# Patient Record
Sex: Female | Born: 1950 | Race: Black or African American | Hispanic: No | Smoking: Never smoker
Health system: Southern US, Community
[De-identification: ages and names within clinical notes are randomized; demographics above are authoritative.]

## PROBLEM LIST (undated history)

## (undated) DIAGNOSIS — H9190 Unspecified hearing loss, unspecified ear: Secondary | ICD-10-CM

## (undated) DIAGNOSIS — N9489 Other specified conditions associated with female genital organs and menstrual cycle: Secondary | ICD-10-CM

## (undated) DIAGNOSIS — H9313 Tinnitus, bilateral: Secondary | ICD-10-CM

## (undated) DIAGNOSIS — N950 Postmenopausal bleeding: Secondary | ICD-10-CM

## (undated) DIAGNOSIS — M779 Enthesopathy, unspecified: Secondary | ICD-10-CM

## (undated) DIAGNOSIS — Z9289 Personal history of other medical treatment: Secondary | ICD-10-CM

## (undated) DIAGNOSIS — D649 Anemia, unspecified: Secondary | ICD-10-CM

## (undated) DIAGNOSIS — E785 Hyperlipidemia, unspecified: Secondary | ICD-10-CM

## (undated) DIAGNOSIS — E7800 Pure hypercholesterolemia, unspecified: Secondary | ICD-10-CM

## (undated) DIAGNOSIS — M1990 Unspecified osteoarthritis, unspecified site: Secondary | ICD-10-CM

## (undated) DIAGNOSIS — E119 Type 2 diabetes mellitus without complications: Secondary | ICD-10-CM

## (undated) HISTORY — PX: TUBAL LIGATION: SHX77

## (undated) HISTORY — DX: Hyperlipidemia, unspecified: E78.5

## (undated) HISTORY — PX: COLONOSCOPY: SHX174

---

## 2011-10-16 ENCOUNTER — Other Ambulatory Visit (HOSPITAL_COMMUNITY)
Admission: RE | Admit: 2011-10-16 | Discharge: 2011-10-16 | Disposition: A | Discharge status: Home / Self Care | Payer: BC Managed Care – PPO | Source: Ambulatory Visit | Attending: Family Medicine | Admitting: Family Medicine

## 2011-10-16 DIAGNOSIS — Z01419 Encounter for gynecological examination (general) (routine) without abnormal findings: Secondary | ICD-10-CM | POA: Insufficient documentation

## 2011-10-16 DIAGNOSIS — Z1159 Encounter for screening for other viral diseases: Secondary | ICD-10-CM | POA: Insufficient documentation

## 2012-03-31 ENCOUNTER — Ambulatory Visit
Admission: RE | Admit: 2012-03-31 | Discharge: 2012-03-31 | Disposition: A | Discharge status: Home / Self Care | Payer: BC Managed Care – PPO | Source: Ambulatory Visit | Attending: Family Medicine | Admitting: Family Medicine

## 2012-03-31 ENCOUNTER — Other Ambulatory Visit: Payer: Self-pay | Admitting: Family Medicine

## 2012-03-31 DIAGNOSIS — M7500 Adhesive capsulitis of unspecified shoulder: Secondary | ICD-10-CM

## 2012-03-31 DIAGNOSIS — R52 Pain, unspecified: Secondary | ICD-10-CM

## 2013-11-13 ENCOUNTER — Other Ambulatory Visit: Payer: Self-pay | Admitting: Family Medicine

## 2013-11-13 ENCOUNTER — Ambulatory Visit
Admission: RE | Admit: 2013-11-13 | Discharge: 2013-11-13 | Disposition: A | Discharge status: Home / Self Care | Payer: BC Managed Care – PPO | Source: Ambulatory Visit | Attending: Family Medicine | Admitting: Family Medicine

## 2013-11-13 DIAGNOSIS — M25552 Pain in left hip: Secondary | ICD-10-CM

## 2013-11-13 DIAGNOSIS — M545 Low back pain: Secondary | ICD-10-CM

## 2014-08-29 ENCOUNTER — Ambulatory Visit (HOSPITAL_COMMUNITY): Payer: BC Managed Care – PPO | Attending: Emergency Medicine

## 2014-08-29 ENCOUNTER — Encounter (HOSPITAL_COMMUNITY): Payer: Self-pay | Admitting: Emergency Medicine

## 2014-08-29 ENCOUNTER — Emergency Department (HOSPITAL_COMMUNITY)
Admission: EM | Admit: 2014-08-29 | Discharge: 2014-08-29 | Disposition: A | Discharge status: Home / Self Care | Payer: BC Managed Care – PPO | Source: Home / Self Care | Attending: Emergency Medicine | Admitting: Emergency Medicine

## 2014-08-29 DIAGNOSIS — M1610 Unilateral primary osteoarthritis, unspecified hip: Secondary | ICD-10-CM | POA: Insufficient documentation

## 2014-08-29 DIAGNOSIS — M169 Osteoarthritis of hip, unspecified: Secondary | ICD-10-CM | POA: Diagnosis present

## 2014-08-29 DIAGNOSIS — Y9229 Other specified public building as the place of occurrence of the external cause: Secondary | ICD-10-CM

## 2014-08-29 DIAGNOSIS — S82402A Unspecified fracture of shaft of left fibula, initial encounter for closed fracture: Secondary | ICD-10-CM

## 2014-08-29 DIAGNOSIS — W108XXA Fall (on) (from) other stairs and steps, initial encounter: Secondary | ICD-10-CM

## 2014-08-29 DIAGNOSIS — W19XXXA Unspecified fall, initial encounter: Secondary | ICD-10-CM

## 2014-08-29 DIAGNOSIS — S82409A Unspecified fracture of shaft of unspecified fibula, initial encounter for closed fracture: Secondary | ICD-10-CM

## 2014-08-29 HISTORY — DX: Pure hypercholesterolemia, unspecified: E78.00

## 2014-08-29 HISTORY — DX: Type 2 diabetes mellitus without complications: E11.9

## 2014-08-29 MED ORDER — HYDROCODONE-ACETAMINOPHEN 5-325 MG PO TABS
ORAL_TABLET | ORAL | Status: AC
  Filled 2014-08-29: qty 2

## 2014-08-29 MED ORDER — HYDROCODONE-ACETAMINOPHEN 5-325 MG PO TABS
2.0000 | ORAL_TABLET | Freq: Once | ORAL | Status: AC
  Administered 2014-08-29: 2 via ORAL

## 2014-08-29 MED ORDER — HYDROCODONE-ACETAMINOPHEN 5-325 MG PO TABS: ORAL_TABLET | ORAL | Status: AC

## 2014-08-29 NOTE — Discharge Instructions (Signed)
Fibular Fracture, Ankle, Adult, Treated With or Without Immobilization °A fibular fracture at your ankle is a break (fracture) bone in the smallest of the two bones in your lower leg, located on the outside of your leg (fibula) close to the area at your ankle joint. °CAUSES °· Rolling your ankle. °· Twisting your ankle. °· Extreme flexing or extending of your foot. °· Severe force on your ankle as when falling from a distance. °RISK FACTORS °· Jumping activities. °· Participation in sports. °· Osteoporosis. °· Advanced age. °· Previous ankle injuries. °SIGNS AND SYMPTOMS °· Pain. °· Swelling. °· Inability to put weight on injured ankle. °· Bruising. °· Bone deformities at site of injury. °DIAGNOSIS  °This fracture is diagnosed with the help of an X-ray exam. °TREATMENT  °If the fractured bone did not move out of place it usually will heal without problems and does casting or splinting. If immobilization is needed for comfort or the fractured bone moved out of place and will not heal properly with immobilization, a cast or splint will be used. °HOME CARE INSTRUCTIONS  °· Apply ice to the area of injury: °¨ Put ice in a plastic bag. °¨ Place a towel between your skin and the bag. °¨ Leave the ice on for 20 minutes, 2-3 times a day. °· Use crutches as directed. Resume walking without crutches as directed by your health care provider. °· Only take over-the-counter or prescription medicines for pain, discomfort, or fever as directed by your health care provider. °· If you have a removable splint or boot, do not remove the boot unless directed by your health care provider. °SEEK MEDICAL CARE IF:  °· You have continued pain or more swelling °· The medications do not control the pain. °SEEK IMMEDIATE MEDICAL CARE IF: °· You develop severe pain in the leg or foot. °· Your skin or nails below the injury turn blue or grey or feel cold or numb. °MAKE SURE YOU:  °· Understand these instructions. °· Will watch your  condition. °· Will get help right away if you are not doing well or get worse. °Document Released: 12/10/2005 Document Revised: 09/30/2013 Document Reviewed: 07/22/2013 °ExitCare® Patient Information ©2015 [employer]. This information is not intended to replace advice given to you by your health care provider. Make sure you discuss any questions you have with your health care provider. ° °

## 2014-08-29 NOTE — ED Notes (Signed)
Fell @ 1500. She was going down steps and missed the last step.  She fell forward onto her hands and knees.  C/o pain L ankle all the way up her leg to her knee, low back and L hip.  Not ambulatory since fall.  R ankle swollen lat .>  medial. She has had ice on it since she has been here.

## 2014-08-29 NOTE — ED Notes (Signed)
Called radiology and made them aware of x-rays.

## 2014-08-29 NOTE — ED Notes (Signed)
Patient transported to X-ray to hospital by shuttle with one family member.

## 2014-08-29 NOTE — ED Provider Notes (Signed)
Chief Complaint   Chief Complaint  Patient presents with  . Fall    History of Present Illness   Shannon [name] is a 63 year old female who fell today around 3 PM while at church. She was going down some steps, when she missed 2 steps, falling forward on the sidewalk. She denies any syncope or presyncope, and states that she just slipped and fell. She did not hit her head and there was no loss of consciousness. She was able to get up but had pain in her left leg and was unable to bear weight. She now has pain in her left ankle with swelling over lateral malleolus, pain in the left hip overlying the greater trochanter, and pain in the lower back. She denies any headache, neck pain, chest or abdominal pain, upper back pain, or upper extremity pain. She is nonambulatory. Denies any neurological symptoms or vomiting.  Review of Systems   Other than as noted above, the patient denies any of the following symptoms: ENT:  No headache, facial pain, or bleeding from the nose or ears.  No loose or broken teeth. Neck:  No neck pain or stiffnes. Cardiac:  No chest pain. No palpitations, dizziness, syncope or fainting. GI:  No abdominal pain. No nausea or vomiting. M-S:  No extremity pain, swelling, bruising, limited ROM, or back pain. Neuro:  No loss of consciousness, seizure activity, dizziness, vertigo, paresthesias, numbness, or weakness.  No difficulty with speech or ambulation.  [place]   Past medical history, family history, social history, meds, and allergies were reviewed.  She is allergic to sulfa. She takes medication for cholesterol and diabetes.  Physical Examination    Vital signs:  BP 140/88  Pulse 75  Temp(Src) 99.3 F (37.4 C) (Oral)  Resp 14  SpO2 100% General:  Alert, oriented and in no distress. Eye:  PERRL, full EOMs. ENT:  No cranial or facial tenderness to palpation. Neck:  No tenderness to palpation.  Full ROM without pain. Heart:  Regular rhythm.  No extrasystoles,  gallops, or murmers. Lungs:  No chest wall tenderness to palpation. Breath sounds clear and equal bilaterally.  No wheezes, rales or rhonchi. Abdomen:  Non tender. Back:  She has midline tenderness to palpation in the lower lumbar spine.  Full ROM without pain. Extremities:  There is swelling and pain to palpation over the lateral malleolus of the left ankle. The ankle has a limited range of motion with pain. Exam of the hip reveals pain to palpation of the greater trochanter.  Full ROM of all joints without pain.  Pulses full.  Brisk capillary refill. Neuro:  Alert and oriented times 3.  Cranial nerves intact.  No muscle weakness.  Sensation intact to light touch.  Gait normal. Skin:  No bruising, abrasions, or lacerations.  Radiology   Dg Lumbar Spine Complete  08/29/2014   CLINICAL DATA:  Fall, low back pain  EXAM: LUMBAR SPINE - COMPLETE 4+ VIEW  COMPARISON:  11/13/2013  FINDINGS: Five lumbar type vertebral bodies.  Normal lumbar lordosis.  No evidence of fracture or dislocation. Vertebral body heights are maintained.  Mild multilevel degenerative changes.  Visualized bony pelvis appears intact.  Calcified uterine fibroid.  IMPRESSION: No fracture or dislocation is seen.  Mild degenerative changes.   Electronically Signed   By: Julian Hy M.D.   On: 08/29/2014 17:28   Dg Hip Complete Left  08/29/2014   CLINICAL DATA:  Fall.  Left lateral hip pain.  EXAM: LEFT HIP - COMPLETE  2+ VIEW  COMPARISON:  11/13/2013  FINDINGS: Prominent degenerative spurring of both hips. Probable degenerative subcortical cyst formation along the acetabulum. Appearance similar to prior exam. No acute bony findings.  Calcified uterine fibroid.  IMPRESSION: 1. Degenerative arthropathy of both hips. No acute bony findings. If the patient is unable to bear weight or if further workup is warranted, MRI would be the preferred imaging method although CT could be alternatively utilized.   Electronically Signed   By: Sherryl Barters M.D.   On: 08/29/2014 17:35   Dg Ankle Complete Left  08/29/2014   CLINICAL DATA:  Fall, lateral ankle pain  EXAM: LEFT ANKLE COMPLETE - 3+ VIEW  COMPARISON:  None.  FINDINGS: Nondisplaced oblique fracture involving the distal fibula.  Ankle mortise is otherwise intact.  Overlying of moderate soft tissue swelling.  Moderate posterior and plantar calcaneal enthesophyte.  IMPRESSION: Nondisplaced oblique fracture involving the distal fibula.   Electronically Signed   By: Julian Hy M.D.   On: 08/29/2014 17:27    Course in Urgent [place]   She was placed in a Cam Walker boot and given crutches for ambulation.  Assessment   The primary encounter diagnosis was Closed fibular fracture, left, initial encounter. Diagnoses of Fall, initial encounter and Place of occurrence, public building were also pertinent to this visit.  Plan   1.  Meds:  The following meds were prescribed:   New Prescriptions   HYDROCODONE-ACETAMINOPHEN (NORCO/VICODIN) 5-325 MG PER TABLET    1 to 2 tabs every 4 to 6 hours as needed for pain.    2.  Patient Education/Counseling:  The patient was given appropriate handouts, self care instructions, and instructed in symptomatic relief.    3.  Follow up:  The patient was told to follow up here if no better in 3 to 4 days, or sooner if becoming worse in any way, and given some red flag symptoms such as increasing pain or new neurological symptoms which would prompt immediate return.  Follow up with [place] within the next week.     Harden Mo, MD 08/29/14 [id number]

## 2015-03-29 ENCOUNTER — Ambulatory Visit
Admission: RE | Admit: 2015-03-29 | Discharge: 2015-03-29 | Disposition: A | Discharge status: Home / Self Care | Payer: BC Managed Care – PPO | Source: Ambulatory Visit | Attending: Family Medicine | Admitting: Family Medicine

## 2015-03-29 ENCOUNTER — Other Ambulatory Visit: Payer: Self-pay | Admitting: Family Medicine

## 2015-03-29 DIAGNOSIS — M150 Primary generalized (osteo)arthritis: Secondary | ICD-10-CM

## 2017-12-10 ENCOUNTER — Ambulatory Visit
Admission: RE | Admit: 2017-12-10 | Discharge: 2017-12-10 | Disposition: A | Discharge status: Home / Self Care | Payer: Medicare Other | Source: Ambulatory Visit | Attending: Internal Medicine | Admitting: Internal Medicine

## 2017-12-10 ENCOUNTER — Other Ambulatory Visit: Payer: Self-pay | Admitting: Internal Medicine

## 2017-12-10 DIAGNOSIS — R059 Cough, unspecified: Secondary | ICD-10-CM

## 2017-12-10 DIAGNOSIS — R05 Cough: Secondary | ICD-10-CM

## 2020-06-02 DIAGNOSIS — M160 Bilateral primary osteoarthritis of hip: Secondary | ICD-10-CM | POA: Diagnosis not present

## 2020-06-02 DIAGNOSIS — M542 Cervicalgia: Secondary | ICD-10-CM | POA: Diagnosis not present

## 2020-06-02 DIAGNOSIS — Z13220 Encounter for screening for lipoid disorders: Secondary | ICD-10-CM | POA: Diagnosis not present

## 2020-06-02 DIAGNOSIS — Z131 Encounter for screening for diabetes mellitus: Secondary | ICD-10-CM | POA: Diagnosis not present

## 2020-06-02 DIAGNOSIS — G8929 Other chronic pain: Secondary | ICD-10-CM | POA: Diagnosis not present

## 2020-06-02 DIAGNOSIS — Z20822 Contact with and (suspected) exposure to covid-19: Secondary | ICD-10-CM | POA: Diagnosis not present

## 2020-06-02 DIAGNOSIS — Z79899 Other long term (current) drug therapy: Secondary | ICD-10-CM | POA: Diagnosis not present

## 2020-06-02 DIAGNOSIS — Z6834 Body mass index (BMI) 34.0-34.9, adult: Secondary | ICD-10-CM | POA: Diagnosis not present

## 2020-06-02 DIAGNOSIS — M545 Low back pain: Secondary | ICD-10-CM | POA: Diagnosis not present

## 2020-06-14 DIAGNOSIS — Z1211 Encounter for screening for malignant neoplasm of colon: Secondary | ICD-10-CM | POA: Diagnosis not present

## 2020-06-14 DIAGNOSIS — Z1212 Encounter for screening for malignant neoplasm of rectum: Secondary | ICD-10-CM | POA: Diagnosis not present

## 2020-07-02 ENCOUNTER — Ambulatory Visit: Payer: Medicare Other | Attending: Internal Medicine

## 2020-07-02 DIAGNOSIS — Z23 Encounter for immunization: Secondary | ICD-10-CM

## 2020-07-02 NOTE — Progress Notes (Signed)
   Covid-19 Vaccination Clinic  Name:  Terea Neubauer    MRN: [id number] DOB: Dec 08, 1951  07/02/2020  Ms. Gaul was observed post Covid-19 immunization for 15 minutes without incident. She was provided with Vaccine Information Sheet and instruction to access the V-Safe system.   Ms. Bradburn was instructed to call 911 with any severe reactions post vaccine: Marland Kitchen Difficulty breathing  . Swelling of face and throat  . A fast heartbeat  . A bad rash all over body  . Dizziness and weakness   Immunizations Administered    Name Date Dose VIS Date Route   Pfizer COVID-19 Vaccine 07/02/2020 10:08 AM 0.3 mL 02/17/2019 Intramuscular   Manufacturer: [employer], [employer]   Lot: [id number]   [place]: [id number]

## 2020-07-23 ENCOUNTER — Ambulatory Visit: Payer: Medicare PPO | Attending: Internal Medicine

## 2020-07-23 DIAGNOSIS — Z23 Encounter for immunization: Secondary | ICD-10-CM

## 2020-07-23 NOTE — Progress Notes (Signed)
   Covid-19 Vaccination Clinic  Name:  Lonni Dirden    MRN: [id number] DOB: 12/15/1951  07/23/2020  Ms. Lemmerman was observed post Covid-19 immunization for 15 minutes without incident. She was provided with Vaccine Information Sheet and instruction to access the V-Safe system.   Ms. Blumenberg was instructed to call 911 with any severe reactions post vaccine: Marland Kitchen Difficulty breathing  . Swelling of face and throat  . A fast heartbeat  . A bad rash all over body  . Dizziness and weakness   Immunizations Administered    Name Date Dose VIS Date Route   Pfizer COVID-19 Vaccine 07/23/2020  9:16 AM 0.3 mL 02/17/2019 Intramuscular   Manufacturer: [employer], [employer]   Lot: [id number]   [place]: [id number]

## 2020-09-02 ENCOUNTER — Encounter (HOSPITAL_COMMUNITY): Payer: Self-pay

## 2020-09-02 ENCOUNTER — Other Ambulatory Visit: Payer: Self-pay

## 2020-09-02 ENCOUNTER — Emergency Department (HOSPITAL_COMMUNITY)
Admission: EM | Admit: 2020-09-02 | Discharge: 2020-09-03 | Disposition: A | Discharge status: Home / Self Care | Payer: Medicare PPO | Attending: Emergency Medicine | Admitting: Emergency Medicine

## 2020-09-02 ENCOUNTER — Ambulatory Visit (HOSPITAL_COMMUNITY)
Admission: EM | Admit: 2020-09-02 | Discharge: 2020-09-02 | Disposition: A | Discharge status: Home / Self Care | Payer: Medicare PPO | Attending: Emergency Medicine | Admitting: Emergency Medicine

## 2020-09-02 DIAGNOSIS — N838 Other noninflammatory disorders of ovary, fallopian tube and broad ligament: Secondary | ICD-10-CM | POA: Diagnosis not present

## 2020-09-02 DIAGNOSIS — N939 Abnormal uterine and vaginal bleeding, unspecified: Secondary | ICD-10-CM | POA: Diagnosis not present

## 2020-09-02 DIAGNOSIS — N950 Postmenopausal bleeding: Secondary | ICD-10-CM | POA: Diagnosis not present

## 2020-09-02 DIAGNOSIS — R102 Pelvic and perineal pain: Secondary | ICD-10-CM | POA: Diagnosis not present

## 2020-09-02 DIAGNOSIS — E119 Type 2 diabetes mellitus without complications: Secondary | ICD-10-CM | POA: Diagnosis not present

## 2020-09-02 DIAGNOSIS — R109 Unspecified abdominal pain: Secondary | ICD-10-CM

## 2020-09-02 DIAGNOSIS — Z79899 Other long term (current) drug therapy: Secondary | ICD-10-CM | POA: Diagnosis not present

## 2020-09-02 DIAGNOSIS — R1030 Lower abdominal pain, unspecified: Secondary | ICD-10-CM | POA: Diagnosis not present

## 2020-09-02 LAB — BASIC METABOLIC PANEL
Anion gap: 9 (ref 5–15)
BUN: 12 mg/dL (ref 8–23)
CO2: 23 mmol/L (ref 22–32)
Calcium: 9.4 mg/dL (ref 8.9–10.3)
Chloride: 107 mmol/L (ref 98–111)
Creatinine, Ser: 0.91 mg/dL (ref 0.44–1.00)
GFR calc Af Amer: >60 mL/min (ref >60)
GFR calc non Af Amer: >60 mL/min (ref >60)
Glucose, Bld: 93 mg/dL (ref 70–99)
Potassium: 4.1 mmol/L (ref 3.5–5.1)
Sodium: 139 mmol/L (ref 135–145)

## 2020-09-02 LAB — URINALYSIS, ROUTINE W REFLEX MICROSCOPIC
Bilirubin Urine: NEGATIVE
Glucose, UA: NEGATIVE mg/dL
Ketones, ur: 20 mg/dL — AB
Leukocytes,Ua: NEGATIVE
Nitrite: NEGATIVE
Protein, ur: NEGATIVE mg/dL
Specific Gravity, Urine: 1.019 (ref 1.005–1.030)
pH: 5 (ref 5.0–8.0)

## 2020-09-02 LAB — CBC
HCT: 42.7 % (ref 36.0–46.0)
Hemoglobin: 13.6 g/dL (ref 12.0–15.0)
MCH: 28.3 pg (ref 26.0–34.0)
MCHC: 31.9 g/dL (ref 30.0–36.0)
MCV: 88.8 fL (ref 80.0–100.0)
Platelets: 301 10*3/uL (ref 150–400)
RBC: 4.81 MIL/uL (ref 3.87–5.11)
RDW: 12.3 % (ref 11.5–15.5)
WBC: 4.7 10*3/uL (ref 4.0–10.5)
nRBC: 0 % (ref 0.0–0.2)

## 2020-09-02 LAB — POCT URINALYSIS DIPSTICK, ED / UC
Bilirubin Urine: NEGATIVE
Glucose, UA: NEGATIVE mg/dL
Ketones, ur: NEGATIVE mg/dL
Nitrite: NEGATIVE
Protein, ur: NEGATIVE mg/dL
Specific Gravity, Urine: <=1.005 (ref 1.005–1.030)
Urobilinogen, UA: 0.2 mg/dL (ref 0.0–1.0)
pH: 5 (ref 5.0–8.0)

## 2020-09-02 MED ORDER — HYDROMORPHONE HCL 1 MG/ML IJ SOLN
0.5000 mg | Freq: Once | INTRAMUSCULAR | Status: AC
Start: 2020-09-02 — End: 2020-09-02
  Administered 2020-09-02: 0.5 mg via INTRAVENOUS
  Filled 2020-09-02: qty 1

## 2020-09-02 NOTE — ED Provider Notes (Signed)
[facility]    CSN: [id number] Arrival date & time: 09/02/20  5809      History   Chief Complaint Chief Complaint  Patient presents with   Abdominal Pain    HPI Cardelia Sassano is a 69 y.o. female.   Brenton Grills presents with complaints of abdominal pain. Started two days ago. Today has become more intense. Had vaginal spotting two days ago, today with "gush" of vaginal bleeding. Pain is "almost unbearable."  Now still with dark red vaginal bleeding. No dizziness. No fevers. No previous abdominal or pelvic surgeries. History of tubal ligation. 4 pregnancies, 4 children. No nausea or vomiting. No constipation. Does have a gynecologist, next appointment is next month. Pain was 10/10 on arrival, states now is 9/10. No urinary symptoms. Back pain, across mid back. No fevers. Denies any previous similar.     ROS per HPI, negative if not otherwise mentioned.      Past Medical History:  Diagnosis Date   Diabetes mellitus without complication ([place])    prediabetic   High cholesterol     There are no problems to display for this patient.   History reviewed. No pertinent surgical history.  OB History   No obstetric history on file.      Home Medications    Prior to Admission medications   Medication Sig Start Date End Date Taking? Authorizing Provider  Ascorbic Acid (VITAMIN C) 100 MG tablet Take 100 mg by mouth daily.   Yes [provider]  Calcium 250 MG CAPS Take by mouth.   Yes [provider]  Multiple Vitamin (MULTIVITAMIN) tablet Take 1 tablet by mouth daily.   Yes [provider]  atorvastatin (LIPITOR) 10 MG tablet Take 10 mg by mouth daily.    [provider]  HYDROcodone-acetaminophen (NORCO/VICODIN) 5-325 MG per tablet 1 to 2 tabs every 4 to 6 hours as needed for pain. 08/29/14   Harden Mo, MD    Family History Family History  Problem Relation Age of Onset   Dementia Mother    Hypertension  Mother    Heart disease Mother    Pneumonia Father     Social History Social History   Tobacco Use   Smoking status: Never Smoker   Smokeless tobacco: Never Used  Substance Use Topics   Alcohol use: No   Drug use: No     Allergies   Sulfa antibiotics   Review of Systems Review of Systems   Physical Exam Triage Vital Signs ED Triage Vitals [09/02/20 1041]  Enc Vitals Group     BP 118/82     Pulse Rate 76     Resp 16     Temp 98.3 F (36.8 C)     Temp Source Oral     SpO2 100 %     Weight 207 lb (93.9 kg)     Height 5\' 5"  (1.651 m)     Head Circumference      Peak Flow      Pain Score 10     Pain Loc      Pain Edu?      Excl. in [place]?    No data found.  Updated Vital Signs BP 118/82    Pulse 76    Temp 98.3 F (36.8 C) (Oral)    Resp 16    Ht 5\' 5"  (1.651 m)    Wt 207 lb (93.9 kg)    SpO2 100%    BMI 34.45 kg/m  Visual Acuity Right Eye Distance:   Left Eye Distance:   Bilateral Distance:    Right Eye Near:   Left Eye Near:    Bilateral Near:     Physical Exam Constitutional:      General: She is not in acute distress.    Appearance: She is well-developed.  Cardiovascular:     Rate and Rhythm: Normal rate.  Pulmonary:     Effort: Pulmonary effort is normal.  Abdominal:     Tenderness: There is abdominal tenderness in the right lower quadrant, periumbilical area, suprapubic area and left lower quadrant.     Comments: Low abdominal tender on even light palpation   Skin:    General: Skin is warm and dry.  Neurological:     Mental Status: She is alert and oriented to person, place, and time.      UC Treatments / Results  Labs (all labs ordered are listed, but only abnormal results are displayed) Labs Reviewed  POCT URINALYSIS DIPSTICK, ED / UC - Abnormal; Notable for the following components:      Result Value   Hgb urine dipstick MODERATE (*)    Leukocytes,Ua TRACE (*)    All other components within normal limits     EKG   Radiology No results found.  Procedures Procedures (including critical care time)  Medications Ordered in UC Medications - No data to display  Initial Impression / Assessment and Plan / UC Course  I have reviewed the triage vital signs and the nursing notes.  Pertinent labs & imaging results that were available during my care of the patient were reviewed by me and considered in my medical decision making (see chart for details).     69 year old female with 9-10/10 worsening low abdominal pain with vaginal bleeding. Worsening over the past two days. No fevers. No tachycardia. I do feel that imaging is warranted with the amount of pain she is having, in the presence of post menopausal vaginal bleeding. Recommend further evaluation in the ER. Patient's daughter will take her there now. Patient verbalized understanding and agreeable to plan. Wheel chair assistance provided to vehicle  Final Clinical Impressions(s) / UC Diagnoses   Final diagnoses:  Pelvic pain in female  Postmenopausal vaginal bleeding     Discharge Instructions     With your pain worsening, and with postmenopausal vaginal bleeding, I feel that imaging is warranted to further evaluate this. I feel that going to the ER is going to be most appropriate, as we do not have adequate imaging capabilities here in the Urgent care, and as it being a Friday heading into the weekend this should be evaluated.     ED Prescriptions    None     PDMP not reviewed this encounter.   Zigmund Gottron, NP 09/02/20 1115

## 2020-09-02 NOTE — ED Notes (Signed)
Patient is being discharged from the Urgent Care and sent to the Emergency Department via POV . Per Lavone Neri, NP, patient is in need of higher level of care due to further testing. Patient is aware and verbalizes understanding of plan of care.  Vitals:   09/02/20 1041  BP: 118/82  Pulse: 76  Resp: 16  Temp: 98.3 F (36.8 C)  SpO2: 100%

## 2020-09-02 NOTE — Discharge Instructions (Signed)
With your pain worsening, and with postmenopausal vaginal bleeding, I feel that imaging is warranted to further evaluate this. I feel that going to the ER is going to be most appropriate, as we do not have adequate imaging capabilities here in the Urgent care, and as it being a Friday heading into the weekend this should be evaluated.

## 2020-09-02 NOTE — ED Triage Notes (Signed)
Pt arrives to ED w/ c/o vaginal bleeding and lower abdominal pain that started on Weds. Pt reports 9/10 pain.

## 2020-09-02 NOTE — ED Provider Notes (Signed)
[facility] Provider Note   CSN: [id number] Arrival date & time: 09/02/20  1124     History Chief Complaint  Patient presents with  . Vaginal Bleeding    Shannon [name] is a 69 y.o. female.  Presents to ER with concern for abdominal pain, vaginal bleeding.  She reports over the past couple weeks she has had mild crampy lower abdominal pain however the last couple days has had significant increase in her abdominal pain.  Yesterday also noted vaginal spotting and then today noted large amount of vaginal bleeding.  Has gone through about 4 pads today.  Currently pain is lower, worse on the left side, achy, crampy pain.  7 out of 10 in severity.  No associated nausea, vomiting, diarrhea or constipation.  Not sexually active, no vaginal discharge.  No dysuria or hematuria.  No recent weight loss.  HPI     Past Medical History:  Diagnosis Date  . Diabetes mellitus without complication ([facility])    prediabetic  . High cholesterol     There are no problems to display for this patient.   History reviewed. No pertinent surgical history.   OB History   No obstetric history on file.     Family History  Problem Relation Age of Onset  . Dementia Mother   . Hypertension Mother   . Heart disease Mother   . Pneumonia Father     Social History   Tobacco Use  . Smoking status: Never Smoker  . Smokeless tobacco: Never Used  Substance Use Topics  . Alcohol use: No  . Drug use: No    Home Medications Prior to Admission medications   Medication Sig Start Date End Date Taking? Authorizing Provider  Ascorbic Acid (VITAMIN C) 100 MG tablet Take 100 mg by mouth daily.    [provider]  atorvastatin (LIPITOR) 10 MG tablet Take 10 mg by mouth daily.    [provider]  Calcium 250 MG CAPS Take by mouth.    [provider]  HYDROcodone-acetaminophen (NORCO/VICODIN) 5-325 MG per tablet 1 to 2 tabs every 4 to 6 hours as  needed for pain. 08/29/14   Harden Mo, MD  Multiple Vitamin (MULTIVITAMIN) tablet Take 1 tablet by mouth daily.    [provider]    Allergies    Sulfa antibiotics  Review of Systems   Review of Systems  Constitutional: Negative for chills and fever.  HENT: Negative for ear pain and sore throat.   Eyes: Negative for pain and visual disturbance.  Respiratory: Negative for cough and shortness of breath.   Cardiovascular: Negative for chest pain and palpitations.  Gastrointestinal: Positive for abdominal pain. Negative for vomiting.  Genitourinary: Positive for vaginal bleeding. Negative for dysuria and hematuria.  Musculoskeletal: Negative for arthralgias and back pain.  Skin: Negative for color change and rash.  Neurological: Negative for seizures and syncope.  All other systems reviewed and are negative.   Physical Exam Updated Vital Signs BP (!) 158/69   Pulse 71   Temp 98.5 F (36.9 C) (Oral)   Resp 18   Ht 5\' 5"  (1.651 m)   Wt 93.9 kg   SpO2 100%   BMI 34.45 kg/m   Physical Exam Vitals and nursing note reviewed.  Constitutional:      General: She is not in acute distress.    Appearance: She is well-developed.  HENT:     Head: Normocephalic and atraumatic.  Eyes:  Conjunctiva/sclera: Conjunctivae normal.  Cardiovascular:     Rate and Rhythm: Normal rate and regular rhythm.     Heart sounds: No murmur heard.   Pulmonary:     Effort: Pulmonary effort is normal. No respiratory distress.     Breath sounds: Normal breath sounds.  Abdominal:     Palpations: Abdomen is soft.     Tenderness: There is no abdominal tenderness.  Genitourinary:    Comments: Normal-appearing external genitalia, small blood noted in vagina, cervix appears normal, no masses palpated, no adnexal fullness or cervical motion tenderness  [place] RN present for chaperone Musculoskeletal:     Cervical back: Neck supple.  Skin:    General: Skin is warm and dry.   Neurological:     Mental Status: She is alert.     ED Results / Procedures / Treatments   Labs (all labs ordered are listed, but only abnormal results are displayed) Labs Reviewed  URINALYSIS, ROUTINE W REFLEX MICROSCOPIC - Abnormal; Notable for the following components:      Result Value   Hgb urine dipstick MODERATE (*)    Ketones, ur 20 (*)    Bacteria, UA RARE (*)    All other components within normal limits  URINE CULTURE  CBC  BASIC METABOLIC PANEL    EKG None  Radiology No results found.  Procedures Procedures (including critical care time)  Medications Ordered in ED Medications  HYDROmorphone (DILAUDID) injection 0.5 mg (0.5 mg Intravenous Given 09/02/20 2222)    ED Course  I have reviewed the triage vital signs and the nursing notes.  Pertinent labs & imaging results that were available during my care of the patient were reviewed by me and considered in my medical decision making (see chart for details).    MDM Rules/Calculators/A&P                         69 year old lady presenting to ER with concern for lower abdominal pain, vaginal bleeding.  On exam patient is well-appearing with stable vital signs.  Noted generalized tenderness in her abdomen.  There is small amount of vaginal bleeding noted on speculum exam.  CT scan ordered to further evaluate.  If CT scan negative, patient likely would benefit from ultrasound to further assess.  While awaiting imaging and reassessment, patient signed out to Dr. Leonette Monarch.   Final Clinical Impression(s) / ED Diagnoses Final diagnoses:  Vaginal bleeding  Abdominal pain, unspecified abdominal location  Post-menopausal bleeding    Rx / DC Orders ED Discharge Orders    None       Lucrezia Starch, MD 09/02/20 2349

## 2020-09-02 NOTE — ED Triage Notes (Signed)
Pt c/o 10/10 sharp pain in RLQ, LLQ of abdomen and vaginal bleedingx2 days. Pt denies soaking more than 1 pad an hour. Pt denies N/V/D, constipation.

## 2020-09-03 ENCOUNTER — Other Ambulatory Visit (HOSPITAL_COMMUNITY): Payer: Medicare PPO

## 2020-09-03 ENCOUNTER — Emergency Department (HOSPITAL_COMMUNITY): Payer: Medicare PPO

## 2020-09-03 DIAGNOSIS — N838 Other noninflammatory disorders of ovary, fallopian tube and broad ligament: Secondary | ICD-10-CM | POA: Diagnosis not present

## 2020-09-03 MED ORDER — IOHEXOL 300 MG/ML  SOLN
100.0000 mL | Freq: Once | INTRAMUSCULAR | Status: AC | PRN
  Administered 2020-09-03: 100 mL via INTRAVENOUS

## 2020-09-03 MED ORDER — HYDROMORPHONE HCL 1 MG/ML IJ SOLN
0.5000 mg | Freq: Once | INTRAMUSCULAR | Status: AC
  Administered 2020-09-03: 0.5 mg via INTRAVENOUS
  Filled 2020-09-03: qty 1

## 2020-09-03 MED ORDER — ACETAMINOPHEN 500 MG PO TABS
1000.0000 mg | ORAL_TABLET | Freq: Once | ORAL | Status: AC
  Administered 2020-09-03: 1000 mg via ORAL
  Filled 2020-09-03: qty 2

## 2020-09-03 NOTE — Discharge Instructions (Addendum)
This is the findings from your ultrasound:  1. 6.3 x 4.8 x 5.7 cm right adnexal mass, corresponding with  abnormality seen on prior CT, and appears to correspond with an  abnormally enlarged and heterogeneous right ovary. While no definite  discrete mass is measurable by ultrasound, overall appearance  remains concerning for a possible ovarian neoplasm. Gynecologic  referral for further workup and surgical consultation recommended.  Additionally, further assessment with dedicated pelvic MRI, with and  without contrast, may be helpful for further characterization as  clinically warranted.  2. No evidence for right ovarian torsion.  3. Nonvisualization of the left ovary. No left adnexal mass.  4. Thickened endometrial stripe measuring up to 8 mm, considered  abnormal for an asymptomatic post-menopausal female. Endometrial  sampling should be considered to exclude carcinoma.  5. 1.7 cm left uterine fibroid

## 2020-09-04 LAB — URINE CULTURE: Culture: <10000 — AB

## 2020-09-05 DIAGNOSIS — N879 Dysplasia of cervix uteri, unspecified: Secondary | ICD-10-CM | POA: Diagnosis not present

## 2020-09-05 DIAGNOSIS — N950 Postmenopausal bleeding: Secondary | ICD-10-CM | POA: Diagnosis not present

## 2020-09-05 DIAGNOSIS — R1900 Intra-abdominal and pelvic swelling, mass and lump, unspecified site: Secondary | ICD-10-CM | POA: Diagnosis not present

## 2020-09-05 DIAGNOSIS — R1909 Other intra-abdominal and pelvic swelling, mass and lump: Secondary | ICD-10-CM | POA: Diagnosis not present

## 2020-09-12 DIAGNOSIS — R102 Pelvic and perineal pain: Secondary | ICD-10-CM | POA: Diagnosis not present

## 2020-09-12 DIAGNOSIS — N83291 Other ovarian cyst, right side: Secondary | ICD-10-CM | POA: Diagnosis not present

## 2020-09-12 DIAGNOSIS — N950 Postmenopausal bleeding: Secondary | ICD-10-CM | POA: Diagnosis not present

## 2020-09-15 ENCOUNTER — Telehealth: Payer: Self-pay | Admitting: *Deleted

## 2020-09-15 NOTE — Telephone Encounter (Signed)
Called patient and scheduled for a new patient visit on 9/28 at 9:45am. Gave the address and phone number for the clinic. Gave the policy for mask and visitor

## 2020-09-19 ENCOUNTER — Encounter: Payer: Self-pay | Admitting: Gynecologic Oncology

## 2020-09-20 ENCOUNTER — Other Ambulatory Visit: Payer: Self-pay

## 2020-09-20 ENCOUNTER — Encounter: Payer: Self-pay | Admitting: Gynecologic Oncology

## 2020-09-20 ENCOUNTER — Inpatient Hospital Stay: Payer: Medicare PPO | Attending: Gynecologic Oncology | Admitting: Gynecologic Oncology

## 2020-09-20 ENCOUNTER — Other Ambulatory Visit: Payer: Self-pay | Admitting: Gynecologic Oncology

## 2020-09-20 VITALS — BP 144/67 | HR 77 | Temp 98.1°F | Resp 18 | Ht 65.0 in | Wt 205.4 lb

## 2020-09-20 DIAGNOSIS — R9389 Abnormal findings on diagnostic imaging of other specified body structures: Secondary | ICD-10-CM | POA: Diagnosis not present

## 2020-09-20 DIAGNOSIS — N9489 Other specified conditions associated with female genital organs and menstrual cycle: Secondary | ICD-10-CM

## 2020-09-20 DIAGNOSIS — M1990 Unspecified osteoarthritis, unspecified site: Secondary | ICD-10-CM | POA: Insufficient documentation

## 2020-09-20 DIAGNOSIS — E669 Obesity, unspecified: Secondary | ICD-10-CM | POA: Diagnosis not present

## 2020-09-20 DIAGNOSIS — E119 Type 2 diabetes mellitus without complications: Secondary | ICD-10-CM | POA: Insufficient documentation

## 2020-09-20 DIAGNOSIS — E7800 Pure hypercholesterolemia, unspecified: Secondary | ICD-10-CM | POA: Insufficient documentation

## 2020-09-20 DIAGNOSIS — D398 Neoplasm of uncertain behavior of other specified female genital organs: Secondary | ICD-10-CM

## 2020-09-20 DIAGNOSIS — N950 Postmenopausal bleeding: Secondary | ICD-10-CM

## 2020-09-20 DIAGNOSIS — E785 Hyperlipidemia, unspecified: Secondary | ICD-10-CM | POA: Diagnosis not present

## 2020-09-20 DIAGNOSIS — N840 Polyp of corpus uteri: Secondary | ICD-10-CM | POA: Diagnosis not present

## 2020-09-20 NOTE — H&P (View-Only) (Signed)
Consult Note: Gyn-Onc  Consult was requested by Dr. Corinna Capra for the evaluation of Shannon [name] 69 y.o. female  CC:  Chief Complaint  Patient presents with  . adnexal mass    new patient  . postmenopausal bleeding    Assessment/Plan:  Shannon [name]  is a 69 y.o.  year old with a right ovarian mass and thickened endometrium and postmenopausal bleeding.   We will follow-up on today's biopsy. If this shows endometrial cancer, she will require a staging procedure with SLN biopsy and robotic assisted total laparoscopic hysterectomy. If this is benign, she will be a candidate for robotic assisted TLH, BSO, and frozen section of the ovary and staging if malignancy is identified.   The patient and her daughter were informed regarding the steps of the procedure including the potential risks including  bleeding, infection, damage to internal organs (such as bladder,ureters, bowels), blood clot, reoperation and rehospitalization. I explained anticipated postoperative recovery and same day hospital stay.  She is retired and does not need FMLA.  Her daughter will stay with her overnight on the first night after surgery.  She was informed that if malignancy is identified staging procedures will be performed.  We will follow up on the results of today's endometrial biopsy.  HPI: Shannon [name] is a 69 year old P4 who was seen in consultation at the request of Dr Corinna Capra for evaluation of a right adnexal mass, postmenopausal bleeding, thickened endometrium.  The patient reported an approximate 1 year history of left sided pelvic discomfort.  On September 02, 2020 she began experiencing postmenopausal bleeding of dark red blood.  This prompted her to be seen by the emergency department.  At that time a CT scan of the abdomen and pelvis was performed which showed a uterus measuring 9 cm and a heterogeneous cystic and solid mass in the right adnexa measuring 4.1 x 6.1 cm.  The differential diagnosis including  exophytic fibroid.  There was cholelithiasis without cholecystitis.  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  The appendix was normal.  A follow-up transvaginal ultrasound scan was also performed on September 03, 2020 which revealed a uterus measuring 9.9 x 4.4 x 5.7 cm, anteverted, with a 2 cm exophytic fibroid from the fundus and left.  The endometrium was 8 mm in thickness.  There is a trace amount of complex fluid within the endometrial cavity.  The right ovary measured 6.3 x 4.8 x 5.7 cm and was diffusely enlarged with loss of normal ovarian architecture.  The left ovary was not visualized.  She was seen by her OB/GYN office on September 12, 2020 at which time the nurse practitioner attempted endometrial biopsy which revealed fragmented endocervical epithelium with metaplastic changes and scant squamous debris with no endometrial tissue identified.  Ca1 25 performed on September 06, 2020 was normal at 24.4. Her medical history is most significant for obesity.  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  Her surgical history is most significant for a laparoscopic tubal ligation.  Her gynecologic history is remarkable for no prior abnormal Pap smears.,  Regular gynecologic care, and 4 prior vaginal deliveries.  Her family cancer history is unremarkable with no family members with a history of malignancy.  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 She lives with her adult son who is special  needs but does not require physical assistance.   Current Meds:  Outpatient Encounter Medications as of 09/20/2020  Medication Sig  . Ascorbic Acid (VITAMIN C) 100 MG tablet Take 100 mg by mouth daily.  . Calcium 250 MG CAPS Take by mouth.  . calcium-vitamin D (OSCAL WITH D) 500-200 MG-UNIT TABS tablet Take 1 tablet by  mouth 2 (two) times daily.  Marland Kitchen HYDROcodone-acetaminophen (NORCO/VICODIN) 5-325 MG per tablet 1 to 2 tabs every 4 to 6 hours as needed for pain.  Marland Kitchen ibuprofen (ADVIL) 800 MG tablet Take 1 tablet by mouth every 8 (eight) hours as needed.  . Multiple Vitamin (MULTIVITAMIN) tablet Take 1 tablet by mouth daily.  Marland Kitchen atorvastatin (LIPITOR) 10 MG tablet Take 10 mg by mouth daily. (Patient not taking: Reported on 09/19/2020)   No facility-administered encounter medications on file as of 09/20/2020.    Allergy:  Allergies  Allergen Reactions  . Sulfa Antibiotics Shortness Of Breath and Rash    Social Hx:   Social History   Socioeconomic History  . Marital status: Widowed    Spouse name: Not on file  . Number of children: Not on file  . Years of education: Not on file  . Highest education level: Not on file  Occupational History  . Not on file  Tobacco Use  . Smoking status: Never Smoker  . Smokeless tobacco: Never Used  Substance and Sexual Activity  . Alcohol use: No  . Drug use: No  . Sexual activity: Not on file  Other Topics Concern  . Not on file  Social History Narrative  . Not on file   Social Determinants of Health   Financial Resource Strain:   . Difficulty of Paying Living Expenses: Not on file  Food Insecurity:   . Worried About Charity fundraiser in the Last Year: Not on file  . Ran Out of Food in the Last Year: Not on file  Transportation Needs:   . Lack of Transportation (Medical): Not on file  . Lack of Transportation (Non-Medical): Not on file  Physical Activity:   . Days of Exercise per Week: Not on file  . Minutes of Exercise per Session: Not on file  Stress:   . Feeling of Stress : Not on file  Social Connections:   . Frequency of Communication with Friends and Family: Not on file  . Frequency of Social Gatherings with Friends and Family: Not on file  . Attends Religious Services: Not on file  . Active Member of Clubs or Organizations: Not on file  .  Attends Archivist Meetings: Not on file  . Marital Status: Not on file  Intimate Partner Violence:   . Fear of Current or Ex-Partner: Not on file  . Emotionally Abused: Not on file  . Physically Abused: Not on file  . Sexually Abused: Not on file    Past Surgical Hx: History reviewed. No pertinent surgical history.  Past Medical Hx:  Past Medical History:  Diagnosis Date  . Diabetes mellitus without complication (HCC)    prediabetic  . High cholesterol   . Hyperlipidemia     Past Gynecological History: See HPI, 4 prior vaginal births and a tubal ligation No LMP recorded. Patient is postmenopausal.  Family Hx:  Family History  Problem Relation Age of Onset  . Dementia Mother   . Hypertension Mother   . Heart disease Mother   . Pneumonia Father     Review of Systems:  Constitutional  Feels  well,    ENT Normal appearing ears and nares bilaterally Skin/Breast  No rash, sores, jaundice, itching, dryness Cardiovascular  No chest pain, shortness of breath, or edema  Pulmonary  No cough or wheeze.  Gastro Intestinal  No nausea, vomitting, or diarrhoea. No bright red blood per rectum, no abdominal pain, change in bowel movement, or constipation.  Genito Urinary  No frequency, urgency, dysuria, + postmenopausal bleeding Musculo Skeletal  No myalgia, arthralgia, joint swelling or pain  Neurologic  No weakness, numbness, change in gait,  Psychology  No depression, anxiety, insomnia.   Vitals:  Blood pressure (!) 144/67, pulse 77, temperature 98.1 F (36.7 C), temperature source Tympanic, resp. rate 18, height 5\' 5"  (1.651 m), weight 205 lb 6.4 oz (93.2 kg), SpO2 100 %.  Physical Exam: WD in NAD Neck  Supple NROM, without any enlargements.  Lymph Node Survey No cervical supraclavicular or inguinal adenopathy Cardiovascular  Pulse normal rate, regularity and rhythm. S1 and S2 normal.  Lungs  Clear to auscultation bilateraly, without  wheezes/crackles/rhonchi. Good air movement.  Skin  No rash/lesions/breakdown  Psychiatry  Alert and oriented to person, place, and time  Abdomen  Normoactive bowel sounds, abdomen soft, non-tender and obese without evidence of hernia.  Back No CVA tenderness Genito Urinary  Vulva/vagina: Normal external female genitalia.  No lesions. No discharge or bleeding.  Bladder/urethra:  No lesions or masses, well supported bladder  Vagina: normal  Cervix: Normal appearing, no lesions.  Uterus: Mildly enlarged, elongated, good mobility, no parametrial involvement or nodularity.  Adnexa: no discretely palpable masses. Rectal  deferred Extremities  No bilateral cyanosis, clubbing or edema.  Procedure Note:  Preop Dx: endometrial thickening, postmenopausal bleeding Postop Dx: same Procedure: endometrial biopsy Surgeon: Dorann Ou, MD EBL: scant Specimens: endometrium Complications: none Procedure Details: The patient provided verbal consent and verbal timeout was performed.  Speculum was inserted to the vagina and the cervix was visualized.  It was grasped with a single-tooth tenaculum.  3 passes into the endometrium to a depth of 9 cm took place.  A moderate amount of tissue which appeared to be mostly old blood was retrieved.  It was sent for permanent histopathology.  The tenaculum was removed and hemostasis was achieved with silver nitrate sticks.  The patient tolerated the procedure well.   60 minutes of total time was spent for this patient encounter, including preparation, face-to-face counseling with the patient and coordination of care, review of imaging (results and images), communication with the referring provider and documentation of the encounter.   Thereasa Solo, MD  09/20/2020, 10:35 AM

## 2020-09-20 NOTE — Patient Instructions (Addendum)
We will contact you with the results of your endometrial biopsy from today.  Preparing for your Surgery  Plan for surgery on September 29, 2020 with Dr. Everitt Amber at [place] will be scheduled for a robotic assisted total laparoscopic hysterectomy (removal of uterus and cervix), bilateral salpingo-oophorectomy (removal of both ovaries and fallopian tubes), possible staging if cancer identified.   Pre-operative Testing -You will receive a phone call from presurgical testing at [facility] to arrange for a pre-operative appointment over the phone, lab appointment, and COVID test. The COVID test normally happens 3 days prior to the surgery and they ask that you self quarantine after the test up until surgery to decrease chance of exposure.  -Bring your insurance card, copy of an advanced directive if applicable, medication list  -At that visit, you will be asked to sign a consent for a possible blood transfusion in case a transfusion becomes necessary during surgery.  The need for a blood transfusion is rare but having consent is a necessary part of your care.     -You should not be taking blood thinners or aspirin at least ten days prior to surgery unless instructed by your surgeon.  -Do not take supplements such as fish oil (omega 3), red yeast rice, turmeric before your surgery. AVOID REGULAR USE OF IBUPROFEN BEFORE SURGERY.  Day Before Surgery at [facility] [facility] will be asked to take in a light diet the day before surgery. You will be advised you can have clear liquids up until 3 hours before your surgery.    Eat a light diet the day before surgery.  Examples including soups, broths, toast, yogurt, mashed potatoes.  AVOID GAS PRODUCING FOODS. Things to avoid include carbonated beverages (fizzy beverages), raw fruits and raw vegetables, or beans.   If your bowels are filled with gas, your surgeon will have difficulty visualizing your pelvic organs which increases your surgical  risks.  Your role in recovery Your role is to become active as soon as directed by your doctor, while still giving yourself time to heal.  Rest when you feel tired. You will be asked to do the following in order to speed your recovery:  - Cough and breathe deeply. This helps to clear and expand your lungs and can prevent pneumonia after surgery.  - [place]. Do mild physical activity. Walking or moving your legs help your circulation and body functions return to normal. Do not try to get up or walk alone the first time after surgery.   -If you develop swelling on one leg or the other, pain in the back of your leg, redness/warmth in one of your legs, please call the office or go to the Emergency Room to have a doppler to rule out a blood clot. For shortness of breath, chest pain-seek care in the Emergency Room as soon as possible. - Actively manage your pain. Managing your pain lets you move in comfort. We will ask you to rate your pain on a scale of zero to 10. It is your responsibility to tell your doctor or nurse where and how much you hurt so your pain can be treated.  Special Considerations -Your final pathology results from surgery should be available around one week after surgery and the results will be relayed to you when available.  -Dr. Lahoma Crocker is the surgeon that assists your GYN Oncologist with surgery.  If you end up staying the night, the next day after  your surgery you will either see Dr. Denman George, Dr. Berline Lopes, or Dr. Lahoma Crocker.  -FMLA forms can be faxed to [phone] and please allow 5-7 business days for completion.  Pain Management After Surgery -You can use the hydrocodone/APAP and ibuprofen that you have at home for pain medication after surgery. You can also continue use of Miralax to prevent constipation.  -Make sure that you have Tylenol and Ibuprofen at home to use on a regular basis after surgery for pain control. We recommend  alternating the medications every hour to six hours since they work differently and are processed in the body differently for pain relief.  -Review the attached handout on narcotic use and their risks and side effects.   Risks of Surgery Risks of surgery are low but include bleeding, infection, damage to surrounding structures, re-operation, blood clots, and very rarely death.   Blood Transfusion Information (For the consent to be signed before surgery)  We will be checking your blood type before surgery so in case of emergencies, we will know what type of blood you would need.                                            WHAT IS A BLOOD TRANSFUSION?  A transfusion is the replacement of blood or some of its parts. Blood is made up of multiple cells which provide different functions.  Red blood cells carry oxygen and are used for blood loss replacement.  White blood cells fight against infection.  Platelets control bleeding.  Plasma helps clot blood.  Other blood products are available for specialized needs, such as hemophilia or other clotting disorders. BEFORE THE TRANSFUSION  Who gives blood for transfusions?   You may be able to donate blood to be used at a later date on yourself (autologous donation).  Relatives can be asked to donate blood. This is generally not any safer than if you have received blood from a stranger. The same precautions are taken to ensure safety when a relative's blood is donated.  Healthy volunteers who are fully evaluated to make sure their blood is safe. This is blood bank blood. Transfusion therapy is the safest it has ever been in the practice of medicine. Before blood is taken from a donor, a complete history is taken to make sure that person has no history of diseases nor engages in risky social behavior (examples are intravenous drug use or sexual activity with multiple partners). The donor's travel history is screened to minimize risk of transmitting  infections, such as malaria. The donated blood is tested for signs of infectious diseases, such as HIV and hepatitis. The blood is then tested to be sure it is compatible with you in order to minimize the chance of a transfusion reaction. If you or a relative donates blood, this is often done in anticipation of surgery and is not appropriate for emergency situations. It takes many days to process the donated blood. RISKS AND COMPLICATIONS Although transfusion therapy is very safe and saves many lives, the main dangers of transfusion include:   Getting an infectious disease.  Developing a transfusion reaction. This is an allergic reaction to something in the blood you were given. Every precaution is taken to prevent this. The decision to have a blood transfusion has been considered carefully by your caregiver before blood is given. Blood is not given unless the  benefits outweigh the risks.  AFTER SURGERY INSTRUCTIONS  Return to work: 4-6 weeks if applicable  Activity: 1. Be up and out of the bed during the day.  Take a nap if needed.  You may walk up steps but be careful and use the hand rail.  Stair climbing will tire you more than you think, you may need to stop part way and rest.   2. No lifting or straining for 6 weeks over 10 pounds. No pushing, pulling, straining for 6 weeks.  3. No driving for 1 week(s).  Do not drive if you are taking narcotic pain medicine and make sure that your reaction time has returned.   4. You can shower as soon as the next day after surgery. Shower daily.  Use your regular soap and water (not directly on the incision) and pat your incision(s) dry afterwards; don't rub.  No tub baths or submerging your body in water until cleared by your surgeon. If you have the soap that was given to you by pre-surgical testing that was used before surgery, you do not need to use it afterwards because this can irritate your incisions.   5. No sexual activity and nothing in the  vagina for 8 weeks.  6. You may experience a small amount of clear drainage from your incisions, which is normal.  If the drainage persists, increases, or changes color please call the office.  7. Do not use creams, lotions, or ointments such as neosporin on your incisions after surgery until advised by your surgeon because they can cause removal of the dermabond glue on your incisions.    8. You may experience vaginal spotting after surgery or around the 6-8 week mark from surgery when the stitches at the top of the vagina begin to dissolve.  The spotting is normal but if you experience heavy bleeding, call our office.  9. Take Tylenol or ibuprofen first for pain and only use narcotic pain medication for severe pain not relieved by the Tylenol or Ibuprofen.  Monitor your Tylenol intake to a max of 4,000 mg in a 24 hour period. You can alternate these medications after surgery. You can take 650 mg of Tylenol every six hours but if you take the hydrocodone/APAP with tylenol in it, you will need to monitor your total intake of Tylenol.  Diet: 1. Low sodium Heart Healthy Diet is recommended but you are cleared to resume your normal (before surgery) diet after your procedure.  2. It is safe to use a laxative, such as Miralax or Colace, if you have difficulty moving your bowels. Continue taking your Miralax and if you are not having bowel movements, please call our office so we can adjust the medication dosing.  Wound Care: 1. Keep clean and dry.  Shower daily.  Reasons to call the Doctor:  Fever - Oral temperature greater than 100.4 degrees Fahrenheit  Foul-smelling vaginal discharge  Difficulty urinating  Nausea and vomiting  Increased pain at the site of the incision that is unrelieved with pain medicine.  Difficulty breathing with or without chest pain  New calf pain especially if only on one side  Sudden, continuing increased vaginal bleeding with or without clots.   Contacts:     For questions or concerns you should contact:  Dr. Everitt Amber at [phone]  Joylene John, NP at [phone]  After Hours: call [phone] and have the GYN Oncologist paged/contacted   Endometrial Biopsy, Care After This sheet gives you information about how to  care for yourself after your procedure. Your health care provider may also give you more specific instructions. If you have problems or questions, contact your health care provider. What can I expect after the procedure? After the procedure, it is common to have:  Mild cramping.  A small amount of vaginal bleeding for a few days. This is normal. Follow these instructions at home:   Take over-the-counter and prescription medicines only as told by your health care provider.  Do not douche, use tampons, or have sexual intercourse until your health care provider approves.  Return to your normal activities as told by your health care provider. Ask your health care provider what activities are safe for you.  Follow instructions from your health care provider about any activity restrictions, such as restrictions on strenuous exercise or heavy lifting. Contact a health care provider if:  You have heavy bleeding, or bleed for longer than 2 days after the procedure.  You have bad smelling discharge from your vagina.  You have a fever or chills.  You have a burning sensation when urinating or you have difficulty urinating.  You have severe pain in your lower abdomen. Get help right away if:  You have severe cramps in your stomach or back.  You pass large blood clots.  Your bleeding increases.  You become weak or light-headed, or you pass out. Summary  After the procedure, it is common to have mild cramping and a small amount of vaginal bleeding for a few days.  Do not douche, use tampons, or have sexual intercourse until your health care provider approves.  Return to your normal activities as told by your  health care provider. Ask your health care provider what activities are safe for you. This information is not intended to replace advice given to you by your health care provider. Make sure you discuss any questions you have with your health care provider. Document Revised: 11/22/2017 Document Reviewed: 12/26/2016 Elsevier Patient Education  [place].

## 2020-09-20 NOTE — Progress Notes (Signed)
Consult Note: Gyn-Onc  Consult was requested by Dr. Corinna Capra for Shannon evaluation of Shannon [name] 69 y.o. female  CC:  Chief Complaint  [name] presents with  . adnexal mass    new [name]  . postmenopausal bleeding    Assessment/Plan:  Shannon [name]  is a 69 y.o.  year old with a right ovarian mass and thickened endometrium and postmenopausal bleeding.   We will follow-up on today's biopsy. If this shows endometrial cancer, she will require a staging procedure with SLN biopsy and robotic assisted total laparoscopic hysterectomy. If this is benign, she will be a candidate for robotic assisted TLH, BSO, and frozen section of Shannon ovary and staging if malignancy is identified.   Shannon [name] and her daughter were informed regarding Shannon steps of Shannon procedure including Shannon potential risks including  bleeding, infection, damage to internal organs (such as bladder,ureters, bowels), blood clot, reoperation and rehospitalization. I explained anticipated postoperative recovery and same day hospital stay.  She is retired and does not need FMLA.  Her daughter will stay with her overnight on Shannon first night after surgery.  She was informed that if malignancy is identified staging procedures will be performed.  We will follow up on Shannon results of today's endometrial biopsy.  HPI: Shannon [name] is a 69 year old P4 who was seen in consultation at Shannon request of Dr Corinna Capra for evaluation of a right adnexal mass, postmenopausal bleeding, thickened endometrium.  Shannon [name] reported an approximate 1 year history of left sided pelvic discomfort.  On September 02, 2020 she began experiencing postmenopausal bleeding of dark red blood.  This prompted her to be seen by Shannon emergency department.  At that time a CT scan of Shannon abdomen and pelvis was performed which showed a uterus measuring 9 cm and a heterogeneous cystic and solid mass in Shannon right adnexa measuring 4.1 x 6.1 cm.  Shannon differential diagnosis including  exophytic fibroid.  There was cholelithiasis without cholecystitis.  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  Shannon appendix was normal.  A follow-up transvaginal ultrasound scan was also performed on September 03, 2020 which revealed a uterus measuring 9.9 x 4.4 x 5.7 cm, anteverted, with a 2 cm exophytic fibroid from Shannon fundus and left.  Shannon endometrium was 8 mm in thickness.  There is a trace amount of complex fluid within Shannon endometrial cavity.  Shannon right ovary measured 6.3 x 4.8 x 5.7 cm and was diffusely enlarged with loss of normal ovarian architecture.  Shannon left ovary was not visualized.  She was seen by her OB/GYN office on September 12, 2020 at which time Shannon nurse practitioner attempted endometrial biopsy which revealed fragmented endocervical epithelium with metaplastic changes and scant squamous debris with no endometrial tissue identified.  Ca1 25 performed on September 06, 2020 was normal at 24.4. Her medical history is most significant for obesity.  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  Her surgical history is most significant for a laparoscopic tubal ligation.  Her gynecologic history is remarkable for no prior abnormal Pap smears.,  Regular gynecologic care, and 4 prior vaginal deliveries.  Her family cancer history is unremarkable with no family members with a history of malignancy.  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 She lives with her adult son who is special  needs but does not require physical assistance.   Current Meds:  Outpatient Encounter Medications as of 09/20/2020  Medication Sig  . Ascorbic Acid (VITAMIN C) 100 MG tablet Take 100 mg by mouth daily.  . Calcium 250 MG CAPS Take by mouth.  . calcium-vitamin D (OSCAL WITH D) 500-200 MG-UNIT TABS tablet Take 1 tablet by  mouth 2 (two) times daily.  Marland Kitchen HYDROcodone-acetaminophen (NORCO/VICODIN) 5-325 MG per tablet 1 to 2 tabs every 4 to 6 hours as needed for pain.  Marland Kitchen ibuprofen (ADVIL) 800 MG tablet Take 1 tablet by mouth every 8 (eight) hours as needed.  . Multiple Vitamin (MULTIVITAMIN) tablet Take 1 tablet by mouth daily.  Marland Kitchen atorvastatin (LIPITOR) 10 MG tablet Take 10 mg by mouth daily. ([name] not taking: Reported on 09/19/2020)   No facility-administered encounter medications on file as of 09/20/2020.    Allergy:  Allergies  Allergen Reactions  . Sulfa Antibiotics Shortness Of Breath and Rash    Social Hx:   Social History   Socioeconomic History  . Marital status: Widowed    Spouse name: Not on file  . Number of children: Not on file  . Years of education: Not on file  . Highest education level: Not on file  Occupational History  . Not on file  Tobacco Use  . Smoking status: Never Smoker  . Smokeless tobacco: Never Used  Substance and Sexual Activity  . Alcohol use: No  . Drug use: No  . Sexual activity: Not on file  Other Topics Concern  . Not on file  Social History Narrative  . Not on file   Social Determinants of Health   Financial Resource Strain:   . Difficulty of Paying Living Expenses: Not on file  Food Insecurity:   . Worried About Charity fundraiser in [place] Last Year: Not on file  . Ran Out of Food in [place] Last Year: Not on file  Transportation Needs:   . Lack of Transportation (Medical): Not on file  . Lack of Transportation (Non-Medical): Not on file  Physical Activity:   . Days of Exercise per Week: Not on file  . Minutes of Exercise per Session: Not on file  Stress:   . Feeling of Stress : Not on file  Social Connections:   . Frequency of Communication with Friends and Family: Not on file  . Frequency of Social Gatherings with Friends and Family: Not on file  . Attends Religious Services: Not on file  . Active Member of Clubs or Organizations: Not on file  .  Attends Archivist Meetings: Not on file  . Marital Status: Not on file  Intimate Partner Violence:   . Fear of Current or Ex-Partner: Not on file  . Emotionally Abused: Not on file  . Physically Abused: Not on file  . Sexually Abused: Not on file    Past Surgical Hx: History reviewed. No pertinent surgical history.  Past Medical Hx:  Past Medical History:  Diagnosis Date  . Diabetes mellitus without complication (HCC)    prediabetic  . High cholesterol   . Hyperlipidemia     Past Gynecological History: See HPI, 4 prior vaginal births and a tubal ligation No LMP recorded. [name] is postmenopausal.  Family Hx:  Family History  Problem Relation Age of Onset  . Dementia Mother   . Hypertension Mother   . Heart disease Mother   . Pneumonia Father     Review of Systems:  Constitutional  Feels  well,    ENT Normal appearing ears and nares bilaterally Skin/Breast  No rash, sores, jaundice, itching, dryness Cardiovascular  No chest pain, shortness of breath, or edema  Pulmonary  No cough or wheeze.  Gastro Intestinal  No nausea, vomitting, or diarrhoea. No bright red blood per rectum, no abdominal pain, change in bowel movement, or constipation.  Genito Urinary  No frequency, urgency, dysuria, + postmenopausal bleeding Musculo Skeletal  No myalgia, arthralgia, joint swelling or pain  Neurologic  No weakness, numbness, change in gait,  Psychology  No depression, anxiety, insomnia.   Vitals:  Blood pressure (!) 144/67, pulse 77, temperature 98.1 F (36.7 C), temperature source Tympanic, resp. rate 18, height 5\' 5"  (1.651 m), weight 205 lb 6.4 oz (93.2 kg), SpO2 100 %.  Physical Exam: WD in NAD Neck  Supple NROM, without any enlargements.  Lymph Node Survey No cervical supraclavicular or inguinal adenopathy Cardiovascular  Pulse normal rate, regularity and rhythm. S1 and S2 normal.  Lungs  Clear to auscultation bilateraly, without  wheezes/crackles/rhonchi. Good air movement.  Skin  No rash/lesions/breakdown  Psychiatry  Alert and oriented to person, place, and time  Abdomen  Normoactive bowel sounds, abdomen soft, non-tender and obese without evidence of hernia.  Back No CVA tenderness Genito Urinary  Vulva/vagina: Normal external female genitalia.  No lesions. No discharge or bleeding.  Bladder/urethra:  No lesions or masses, well supported bladder  Vagina: normal  Cervix: Normal appearing, no lesions.  Uterus: Mildly enlarged, elongated, good mobility, no parametrial involvement or nodularity.  Adnexa: no discretely palpable masses. Rectal  deferred Extremities  No bilateral cyanosis, clubbing or edema.  Procedure Note:  Preop Dx: endometrial thickening, postmenopausal bleeding Postop Dx: same Procedure: endometrial biopsy Surgeon: Dorann Ou, MD EBL: scant Specimens: endometrium Complications: none Procedure Details: Shannon [name] provided verbal consent and verbal timeout was performed.  Speculum was inserted to Shannon vagina and Shannon cervix was visualized.  It was grasped with a single-tooth tenaculum.  3 passes into Shannon endometrium to a depth of 9 cm took place.  A moderate amount of tissue which appeared to be mostly old blood was retrieved.  It was sent for permanent histopathology.  Shannon tenaculum was removed and hemostasis was achieved with silver nitrate sticks.  Shannon [name] tolerated Shannon procedure well.   60 minutes of total time was spent for this [name] encounter, including preparation, face-to-face counseling with Shannon [name] and coordination of care, review of imaging (results and images), communication with Shannon referring provider and documentation of Shannon encounter.   Thereasa Solo, MD  09/20/2020, 10:35 AM

## 2020-09-22 ENCOUNTER — Telehealth: Payer: Self-pay | Admitting: *Deleted

## 2020-09-22 LAB — SURGICAL PATHOLOGY

## 2020-09-22 NOTE — Telephone Encounter (Signed)
Called and spoke with the patient's daughter; explained that her FMLA paperwork was filled out and fax for her. Explained per Melissa APP that it was written for 4 weeks not 6 weeks due the surgery being laparoscopic. Daughter verbalized understanding

## 2020-09-23 ENCOUNTER — Encounter (HOSPITAL_COMMUNITY): Payer: Self-pay

## 2020-09-23 ENCOUNTER — Telehealth: Payer: Self-pay

## 2020-09-23 NOTE — Progress Notes (Addendum)
COVID Vaccine Completed: Yes Date COVID Vaccine completed: 07/02/20, 07/23/20 COVID vaccine manufacturer: [employer]      PCP - Fredrich Romans PA Cardiologist - Dr. Einar Gip about 3 years ago  Chest x-ray - greater than 1 year EKG - 09/26/20 in epic (pre op) Stress Test - greater than 2 years ECHO - N/A Cardiac Cath - N/A Pacemaker/ICD device last checked: N/A  Sleep Study - N/A CPAP - N/A  Fasting Blood Sugar - N/A Checks Blood Sugar _N/A____ times a day  Blood Thinner Instructions: N/A Aspirin Instructions: N/A Last Dose: N/A  Anesthesia review: N/A  Patient denies shortness of breath, fever, cough and chest pain at PAT appointment   Patient verbalized understanding of instructions that were given to them at the PAT appointment. Patient was also instructed that they will need to review over the PAT instructions again at home before surgery.

## 2020-09-23 NOTE — Patient Instructions (Signed)
DUE TO COVID-19 ONLY ONE VISITOR IS ALLOWED TO COME WITH YOU AND STAY IN THE WAITING ROOM ONLY DURING PRE OP AND PROCEDURE.    COVID SWAB TESTING MUST BE COMPLETED ON:  Today, Oct. 4, 2021 at 2:35 PM   [address] [place], [place] [address]  (Must self quarantine after testing. Follow instructions on handout.)       Your procedure is scheduled on: Thursday, Oct. 7, 2021   Report to [facility] Main  Entrance    Report to admitting at 7:30 AM   Call this number if you have problems the morning of surgery [phone]   Do not eat food :After Midnight.   May have liquids until 6:30 AM   day of surgery  CLEAR LIQUID DIET  Foods Allowed                                                                     Foods Excluded  Water, Black Coffee and tea, regular and decaf                             liquids that you cannot  Plain Jell-O in any flavor  (No red)                                           see through such as: Fruit ices (not with fruit pulp)                                     milk, soups, orange juice              Iced Popsicles (No red)                                    All solid food                                   Apple juices Sports drinks like Gatorade (No red) Lightly seasoned clear broth or consume(fat free) Sugar, honey syrup  Sample Menu Breakfast                                Lunch                                     Supper Cranberry juice                    Beef broth                            Chicken broth Jell-O  Grape juice                           Apple juice Coffee or tea                        Jell-O                                      Popsicle                                                Coffee or tea                        Coffee or tea      Oral Hygiene is also important to reduce your risk of infection.                                    Remember - BRUSH YOUR TEETH THE MORNING OF SURGERY WITH YOUR  REGULAR TOOTHPASTE   Do NOT smoke after Midnight   Take these medicines the morning of surgery with A SIP OF WATER: None  DO NOT TAKE ANY ORAL DIABETIC MEDICATIONS DAY OF YOUR SURGERY                               You may not have any metal on your body including hair pins, jewelry, and body piercings             Do not wear make-up, lotions, powders, perfumes/cologne, or deodorant             Do not wear nail polish.  Do not shave  48 hours prior to surgery.               Do not bring valuables to the hospital. [place].   Contacts, dentures or bridgework may not be worn into surgery.    Patients discharged the day of surgery will not be allowed to drive home.   Special Instructions: Bring a copy of your healthcare power of attorney and living will documents         the day of surgery if you haven't scanned them in before.              Please read over the following fact sheets you were given: IF YOU HAVE QUESTIONS ABOUT YOUR PRE OP [place] [phone]   How to Manage Your Diabetes Before and After Surgery  Why is it important to control my blood sugar before and after surgery? . Improving blood sugar levels before and after surgery helps healing and can limit problems. . A way of improving blood sugar control is eating a healthy diet by: o  Eating less sugar and carbohydrates o  Increasing activity/exercise o  Talking with your doctor about reaching your blood sugar goals . High blood sugars (greater than 180 mg/dL) can raise your risk of infections and slow your recovery,  so you will need to focus on controlling your diabetes during the weeks before surgery. . Make sure that the doctor who takes care of your diabetes knows about your planned surgery including the date and location.  How do I manage my blood sugar before surgery? . Check your blood sugar at least 4 times a day, starting 2 days before surgery,  to make sure that the level is not too high or low. o Check your blood sugar the morning of your surgery when you wake up and every 2 hours until you get to the Short Stay unit. . If your blood sugar is less than 70 mg/dL, you will need to treat for low blood sugar: o Do not take insulin. o Treat a low blood sugar (less than 70 mg/dL) with  cup of clear juice (cranberry or apple), 4 glucose tablets, OR glucose gel. o Recheck blood sugar in 15 minutes after treatment (to make sure it is greater than 70 mg/dL). If your blood sugar is not greater than 70 mg/dL on recheck, call [phone] for further instructions. . Report your blood sugar to the short stay nurse when you get to Short Stay.  . If you are admitted to the hospital after surgery: o Your blood sugar will be checked by the staff and you will probably be given insulin after surgery (instead of oral diabetes medicines) to make sure you have good blood sugar levels. o The goal for blood sugar control after surgery is 80-180 mg/dL.   WHAT DO I DO ABOUT MY DIABETES MEDICATION?  Marland Kitchen Do not take oral diabetes medicines (pills) the morning of surgery.  Reviewed and Endorsed by [facility] Patient Education Committee, August 2015  [facility] - Preparing for Surgery Before surgery, you can play an important role.  Because skin is not sterile, your skin needs to be as free of germs as possible.  You can reduce the number of germs on your skin by washing with CHG (chlorahexidine gluconate) soap before surgery.  CHG is an antiseptic cleaner which kills germs and bonds with the skin to continue killing germs even after washing. Please DO NOT use if you have an allergy to CHG or antibacterial soaps.  If your skin becomes reddened/irritated stop using the CHG and inform your nurse when you arrive at Short Stay. Do not shave (including legs and underarms) for at least 48 hours prior to the first CHG shower.  You may shave your face/neck.  Please  follow these instructions carefully:  1.  Shower with CHG Soap the night before surgery and the  morning of surgery.  2.  If you choose to wash your hair, wash your hair first as usual with your normal  shampoo.  3.  After you shampoo, rinse your hair and body thoroughly to remove the shampoo.                             4.  Use CHG as you would any other liquid soap.  You can apply chg directly to the skin and wash.  Gently with a scrungie or clean washcloth.  5.  Apply the CHG Soap to your body ONLY FROM THE NECK DOWN.   Do   not use on face/ open                           Wound or open sores. Avoid contact  with eyes, ears mouth and   genitals (private parts).                       Wash face,  Genitals (private parts) with your normal soap.             6.  Wash thoroughly, paying special attention to the area where your    surgery  will be performed.  7.  Thoroughly rinse your body with warm water from the neck down.  8.  DO NOT shower/wash with your normal soap after using and rinsing off the CHG Soap.                9.  Pat yourself dry with a clean towel.            10.  Wear clean pajamas.            11.  Place clean sheets on your bed the night of your first shower and do not  sleep with pets. Day of Surgery : Do not apply any lotions/deodorants the morning of surgery.  Please wear clean clothes to the hospital/surgery center.  FAILURE TO FOLLOW THESE INSTRUCTIONS MAY RESULT IN THE CANCELLATION OF YOUR SURGERY  PATIENT SIGNATURE_________________________________  NURSE SIGNATURE__________________________________  ________________________________________________________________________  WHAT IS A BLOOD TRANSFUSION? Blood Transfusion Information  A transfusion is the replacement of blood or some of its parts. Blood is made up of multiple cells which provide different functions.  Red blood cells carry oxygen and are used for blood loss replacement.  White blood cells fight against  infection.  Platelets control bleeding.  Plasma helps clot blood.  Other blood products are available for specialized needs, such as hemophilia or other clotting disorders. BEFORE THE TRANSFUSION  Who gives blood for transfusions?   Healthy volunteers who are fully evaluated to make sure their blood is safe. This is blood bank blood. Transfusion therapy is the safest it has ever been in the practice of medicine. Before blood is taken from a donor, a complete history is taken to make sure that person has no history of diseases nor engages in risky social behavior (examples are intravenous drug use or sexual activity with multiple partners). The donor's travel history is screened to minimize risk of transmitting infections, such as malaria. The donated blood is tested for signs of infectious diseases, such as HIV and hepatitis. The blood is then tested to be sure it is compatible with you in order to minimize the chance of a transfusion reaction. If you or a relative donates blood, this is often done in anticipation of surgery and is not appropriate for emergency situations. It takes many days to process the donated blood. RISKS AND COMPLICATIONS Although transfusion therapy is very safe and saves many lives, the main dangers of transfusion include:   Getting an infectious disease.  Developing a transfusion reaction. This is an allergic reaction to something in the blood you were given. Every precaution is taken to prevent this. The decision to have a blood transfusion has been considered carefully by your caregiver before blood is given. Blood is not given unless the benefits outweigh the risks. AFTER THE TRANSFUSION  Right after receiving a blood transfusion, you will usually feel much better and more energetic. This is especially true if your red blood cells have gotten low (anemic). The transfusion raises the level of the red blood cells which carry oxygen, and this usually causes an energy  increase.  The  nurse administering the transfusion will monitor you carefully for complications. HOME CARE INSTRUCTIONS  No special instructions are needed after a transfusion. You may find your energy is better. Speak with your caregiver about any limitations on activity for underlying diseases you may have. SEEK MEDICAL CARE IF:   Your condition is not improving after your transfusion.  You develop redness or irritation at the intravenous (IV) site. SEEK IMMEDIATE MEDICAL CARE IF:  Any of the following symptoms occur over the next 12 hours:  Shaking chills.  You have a temperature by mouth above 102 F (38.9 C), not controlled by medicine.  Chest, back, or muscle pain.  People around you feel you are not acting correctly or are confused.  Shortness of breath or difficulty breathing.  Dizziness and fainting.  You get a rash or develop hives.  You have a decrease in urine output.  Your urine turns a dark color or changes to pink, red, or brown. Any of the following symptoms occur over the next 10 days:  You have a temperature by mouth above 102 F (38.9 C), not controlled by medicine.  Shortness of breath.  Weakness after normal activity.  The white part of the eye turns yellow (jaundice).  You have a decrease in the amount of urine or are urinating less often.  Your urine turns a dark color or changes to pink, red, or brown. Document Released: 12/07/2000 Document Revised: 03/03/2012 Document Reviewed: 07/26/2008 [facility] Patient Information 2014 [place], [place].  _______________________________________________________________________

## 2020-09-23 NOTE — Telephone Encounter (Signed)
Told Ms Connett that the endometrial biopsy was benign. Pt verbalized understanding.

## 2020-09-26 ENCOUNTER — Encounter (HOSPITAL_COMMUNITY)
Admission: RE | Admit: 2020-09-26 | Discharge: 2020-09-26 | Disposition: A | Discharge status: Home / Self Care | Payer: Medicare PPO | Source: Ambulatory Visit | Attending: Gynecologic Oncology | Admitting: Gynecologic Oncology

## 2020-09-26 ENCOUNTER — Other Ambulatory Visit (HOSPITAL_COMMUNITY)
Admission: RE | Admit: 2020-09-26 | Discharge: 2020-09-26 | Disposition: A | Discharge status: Home / Self Care | Payer: Medicare PPO | Source: Ambulatory Visit | Attending: Gynecologic Oncology | Admitting: Gynecologic Oncology

## 2020-09-26 ENCOUNTER — Encounter (HOSPITAL_COMMUNITY): Payer: Self-pay

## 2020-09-26 ENCOUNTER — Other Ambulatory Visit: Payer: Self-pay

## 2020-09-26 DIAGNOSIS — Z20822 Contact with and (suspected) exposure to covid-19: Secondary | ICD-10-CM | POA: Diagnosis not present

## 2020-09-26 DIAGNOSIS — Z01818 Encounter for other preprocedural examination: Secondary | ICD-10-CM | POA: Diagnosis not present

## 2020-09-26 DIAGNOSIS — I440 Atrioventricular block, first degree: Secondary | ICD-10-CM

## 2020-09-26 HISTORY — DX: Unspecified hearing loss, unspecified ear: H91.90

## 2020-09-26 HISTORY — DX: Other specified conditions associated with female genital organs and menstrual cycle: N94.89

## 2020-09-26 HISTORY — DX: Personal history of other medical treatment: Z92.89

## 2020-09-26 HISTORY — DX: Atrioventricular block, first degree: I44.0

## 2020-09-26 HISTORY — DX: Enthesopathy, unspecified: M77.9

## 2020-09-26 HISTORY — DX: Tinnitus, bilateral: H93.13

## 2020-09-26 HISTORY — DX: Unspecified osteoarthritis, unspecified site: M19.90

## 2020-09-26 HISTORY — DX: Postmenopausal bleeding: N95.0

## 2020-09-26 HISTORY — DX: Anemia, unspecified: D64.9

## 2020-09-26 LAB — CBC
HCT: 42.1 % (ref 36.0–46.0)
Hemoglobin: 13.5 g/dL (ref 12.0–15.0)
MCH: 28.4 pg (ref 26.0–34.0)
MCHC: 32.1 g/dL (ref 30.0–36.0)
MCV: 88.4 fL (ref 80.0–100.0)
Platelets: 273 10*3/uL (ref 150–400)
RBC: 4.76 MIL/uL (ref 3.87–5.11)
RDW: 12.1 % (ref 11.5–15.5)
WBC: 4.7 10*3/uL (ref 4.0–10.5)
nRBC: 0 % (ref 0.0–0.2)

## 2020-09-26 LAB — COMPREHENSIVE METABOLIC PANEL
ALT: 19 U/L (ref 0–44)
AST: 19 U/L (ref 15–41)
Albumin: 4.3 g/dL (ref 3.5–5.0)
Alkaline Phosphatase: 59 U/L (ref 38–126)
Anion gap: 9 (ref 5–15)
BUN: 17 mg/dL (ref 8–23)
CO2: 24 mmol/L (ref 22–32)
Calcium: 9.1 mg/dL (ref 8.9–10.3)
Chloride: 106 mmol/L (ref 98–111)
Creatinine, Ser: 1.01 mg/dL — ABNORMAL HIGH (ref 0.44–1.00)
GFR calc Af Amer: >60 mL/min (ref >60)
GFR calc non Af Amer: 56 mL/min — ABNORMAL LOW (ref >60)
Glucose, Bld: 110 mg/dL — ABNORMAL HIGH (ref 70–99)
Potassium: 4 mmol/L (ref 3.5–5.1)
Sodium: 139 mmol/L (ref 135–145)
Total Bilirubin: 0.3 mg/dL (ref 0.3–1.2)
Total Protein: 7.1 g/dL (ref 6.5–8.1)

## 2020-09-26 LAB — URINALYSIS, ROUTINE W REFLEX MICROSCOPIC
Bacteria, UA: NONE SEEN
Bilirubin Urine: NEGATIVE
Glucose, UA: NEGATIVE mg/dL
Ketones, ur: NEGATIVE mg/dL
Leukocytes,Ua: NEGATIVE
Nitrite: NEGATIVE
Protein, ur: 30 mg/dL — AB
RBC / HPF: >50 RBC/hpf — ABNORMAL HIGH (ref 0–5)
Specific Gravity, Urine: 1.021 (ref 1.005–1.030)
pH: 7 (ref 5.0–8.0)

## 2020-09-26 LAB — SARS CORONAVIRUS 2 (TAT 6-24 HRS): SARS Coronavirus 2: NEGATIVE

## 2020-09-26 LAB — HEMOGLOBIN A1C
Hgb A1c MFr Bld: 5.7 % — ABNORMAL HIGH (ref 4.8–5.6)
Mean Plasma Glucose: 116.89 mg/dL

## 2020-09-27 ENCOUNTER — Encounter (HOSPITAL_COMMUNITY): Payer: Self-pay

## 2020-09-28 ENCOUNTER — Telehealth: Payer: Self-pay

## 2020-09-28 NOTE — Anesthesia Preprocedure Evaluation (Addendum)
Anesthesia Evaluation  Patient identified by MRN, date of birth, ID band Patient awake    Reviewed: Allergy & Precautions, NPO status , Patient's Chart, lab work & pertinent test results  Airway Mallampati: II  TM Distance: >3 FB Neck ROM: Full    Dental no notable dental hx. (+) Teeth Intact, Dental Advisory Given,    Pulmonary neg pulmonary ROS,    Pulmonary exam normal breath sounds clear to auscultation       Cardiovascular Exercise Tolerance: Good Normal cardiovascular exam Rhythm:Regular Rate:Normal  4 October 21 EKG -SR R 69 w 1 deg AV block   Neuro/Psych negative neurological ROS  negative psych ROS   GI/Hepatic Neg liver ROS,   Endo/Other  negative endocrine ROSPre diabetes  Renal/GU negative Renal ROSK+ 4.0 Cr 1.01  Female GU complaint R ovarian Mass    Musculoskeletal negative musculoskeletal ROS (+) Arthritis ,   Abdominal (+) + obese,   Peds  Hematology negative hematology ROS (+) Hgb 13.5 T&S available    Anesthesia Other Findings All SUlfa  Reproductive/Obstetrics                           Anesthesia Physical Anesthesia Plan  ASA: II  Anesthesia Plan: General   Post-op Pain Management:    Induction: Intravenous  PONV Risk Score and Plan: 4 or greater and Treatment may vary due to age or medical condition, Midazolam, Dexamethasone and Ondansetron  Airway Management Planned: Oral ETT  Additional Equipment: None  Intra-op Plan:   Post-operative Plan: Extubation in OR  Informed Consent: I have reviewed the patients History and Physical, chart, labs and discussed the procedure including the risks, benefits and alternatives for the proposed anesthesia with the patient or authorized representative who has indicated his/her understanding and acceptance.     Dental advisory given  Plan Discussed with: CRNA and Anesthesiologist  Anesthesia Plan Comments: (GA w  Lidocaine infusion)      Anesthesia Quick Evaluation

## 2020-09-28 NOTE — Telephone Encounter (Signed)
Shannon [name] states that she understands her written pre-op instructions and does not have any questions or concerns at this time.

## 2020-09-29 ENCOUNTER — Encounter (HOSPITAL_COMMUNITY): Payer: Self-pay | Admitting: Gynecologic Oncology

## 2020-09-29 ENCOUNTER — Ambulatory Visit (HOSPITAL_COMMUNITY)
Admission: RE | Admit: 2020-09-29 | Discharge: 2020-09-29 | Disposition: A | Discharge status: Home / Self Care | Payer: Medicare PPO | Source: Ambulatory Visit | Attending: Gynecologic Oncology | Admitting: Gynecologic Oncology

## 2020-09-29 ENCOUNTER — Ambulatory Visit (HOSPITAL_COMMUNITY): Payer: Medicare PPO | Admitting: Anesthesiology

## 2020-09-29 ENCOUNTER — Ambulatory Visit (HOSPITAL_COMMUNITY): Payer: Medicare PPO | Admitting: Physician Assistant

## 2020-09-29 ENCOUNTER — Encounter (HOSPITAL_COMMUNITY)
Admission: RE | Disposition: A | Discharge status: Home / Self Care | Payer: Self-pay | Source: Ambulatory Visit | Attending: Gynecologic Oncology

## 2020-09-29 DIAGNOSIS — D259 Leiomyoma of uterus, unspecified: Secondary | ICD-10-CM | POA: Insufficient documentation

## 2020-09-29 DIAGNOSIS — N950 Postmenopausal bleeding: Secondary | ICD-10-CM

## 2020-09-29 DIAGNOSIS — Z882 Allergy status to sulfonamides status: Secondary | ICD-10-CM | POA: Insufficient documentation

## 2020-09-29 DIAGNOSIS — Z820 Family history of epilepsy and other diseases of the nervous system: Secondary | ICD-10-CM | POA: Insufficient documentation

## 2020-09-29 DIAGNOSIS — E7800 Pure hypercholesterolemia, unspecified: Secondary | ICD-10-CM | POA: Diagnosis not present

## 2020-09-29 DIAGNOSIS — R7303 Prediabetes: Secondary | ICD-10-CM | POA: Insufficient documentation

## 2020-09-29 DIAGNOSIS — N939 Abnormal uterine and vaginal bleeding, unspecified: Secondary | ICD-10-CM | POA: Diagnosis not present

## 2020-09-29 DIAGNOSIS — D251 Intramural leiomyoma of uterus: Secondary | ICD-10-CM | POA: Diagnosis not present

## 2020-09-29 DIAGNOSIS — E669 Obesity, unspecified: Secondary | ICD-10-CM

## 2020-09-29 DIAGNOSIS — E785 Hyperlipidemia, unspecified: Secondary | ICD-10-CM | POA: Diagnosis not present

## 2020-09-29 DIAGNOSIS — I440 Atrioventricular block, first degree: Secondary | ICD-10-CM | POA: Diagnosis not present

## 2020-09-29 DIAGNOSIS — Z79899 Other long term (current) drug therapy: Secondary | ICD-10-CM | POA: Insufficient documentation

## 2020-09-29 DIAGNOSIS — N83202 Unspecified ovarian cyst, left side: Secondary | ICD-10-CM | POA: Insufficient documentation

## 2020-09-29 DIAGNOSIS — N83201 Unspecified ovarian cyst, right side: Secondary | ICD-10-CM | POA: Diagnosis not present

## 2020-09-29 DIAGNOSIS — D252 Subserosal leiomyoma of uterus: Secondary | ICD-10-CM | POA: Diagnosis not present

## 2020-09-29 DIAGNOSIS — D270 Benign neoplasm of right ovary: Secondary | ICD-10-CM | POA: Diagnosis not present

## 2020-09-29 DIAGNOSIS — N838 Other noninflammatory disorders of ovary, fallopian tube and broad ligament: Secondary | ICD-10-CM | POA: Diagnosis not present

## 2020-09-29 DIAGNOSIS — N938 Other specified abnormal uterine and vaginal bleeding: Secondary | ICD-10-CM | POA: Diagnosis present

## 2020-09-29 DIAGNOSIS — Z791 Long term (current) use of non-steroidal anti-inflammatories (NSAID): Secondary | ICD-10-CM | POA: Insufficient documentation

## 2020-09-29 DIAGNOSIS — N9489 Other specified conditions associated with female genital organs and menstrual cycle: Secondary | ICD-10-CM

## 2020-09-29 DIAGNOSIS — Z6834 Body mass index (BMI) 34.0-34.9, adult: Secondary | ICD-10-CM | POA: Diagnosis not present

## 2020-09-29 DIAGNOSIS — N800 Endometriosis of uterus: Secondary | ICD-10-CM | POA: Insufficient documentation

## 2020-09-29 DIAGNOSIS — Z8249 Family history of ischemic heart disease and other diseases of the circulatory system: Secondary | ICD-10-CM | POA: Insufficient documentation

## 2020-09-29 DIAGNOSIS — N7011 Chronic salpingitis: Secondary | ICD-10-CM | POA: Diagnosis not present

## 2020-09-29 DIAGNOSIS — M1990 Unspecified osteoarthritis, unspecified site: Secondary | ICD-10-CM | POA: Diagnosis not present

## 2020-09-29 DIAGNOSIS — Z836 Family history of other diseases of the respiratory system: Secondary | ICD-10-CM | POA: Insufficient documentation

## 2020-09-29 HISTORY — PX: ROBOTIC ASSISTED TOTAL HYSTERECTOMY WITH BILATERAL SALPINGO OOPHERECTOMY: SHX6086

## 2020-09-29 LAB — TYPE AND SCREEN
ABO/RH(D): O POS
Antibody Screen: NEGATIVE

## 2020-09-29 LAB — ABO/RH: ABO/RH(D): O POS

## 2020-09-29 SURGERY — HYSTERECTOMY, TOTAL, ROBOT-ASSISTED, LAPAROSCOPIC, WITH BILATERAL SALPINGO-OOPHORECTOMY
Anesthesia: General

## 2020-09-29 MED ORDER — ROCURONIUM BROMIDE 10 MG/ML (PF) SYRINGE
PREFILLED_SYRINGE | INTRAVENOUS | Status: DC | PRN
  Administered 2020-09-29: 10 mg via INTRAVENOUS
  Administered 2020-09-29: 50 mg via INTRAVENOUS
  Administered 2020-09-29 (×2): 10 mg via INTRAVENOUS

## 2020-09-29 MED ORDER — SODIUM CHLORIDE 0.9 % IV SOLN: 250.0000 mL | INTRAVENOUS | Status: DC | PRN

## 2020-09-29 MED ORDER — ORAL CARE MOUTH RINSE: 15.0000 mL | Freq: Once | OROMUCOSAL | Status: AC

## 2020-09-29 MED ORDER — LIDOCAINE 2% (20 MG/ML) 5 ML SYRINGE
INTRAMUSCULAR | Status: DC | PRN
  Administered 2020-09-29: 4.2 mL/h via INTRAVENOUS

## 2020-09-29 MED ORDER — ACETAMINOPHEN 500 MG PO TABS
1000.0000 mg | ORAL_TABLET | ORAL | Status: AC
  Administered 2020-09-29: 1000 mg via ORAL
  Filled 2020-09-29: qty 2

## 2020-09-29 MED ORDER — GABAPENTIN 100 MG PO CAPS
100.0000 mg | ORAL_CAPSULE | ORAL | Status: AC
  Administered 2020-09-29: 100 mg via ORAL
  Filled 2020-09-29: qty 1

## 2020-09-29 MED ORDER — BUPIVACAINE HCL 0.25 % IJ SOLN
INTRAMUSCULAR | Status: AC
  Filled 2020-09-29: qty 1

## 2020-09-29 MED ORDER — PHENYLEPHRINE 40 MCG/ML (10ML) SYRINGE FOR IV PUSH (FOR BLOOD PRESSURE SUPPORT)
PREFILLED_SYRINGE | INTRAVENOUS | Status: AC
  Filled 2020-09-29: qty 20

## 2020-09-29 MED ORDER — OXYCODONE HCL 5 MG PO TABS: 5.0000 mg | ORAL_TABLET | ORAL | Status: DC | PRN

## 2020-09-29 MED ORDER — CHLORHEXIDINE GLUCONATE 0.12 % MT SOLN
15.0000 mL | Freq: Once | OROMUCOSAL | Status: AC
  Administered 2020-09-29: 15 mL via OROMUCOSAL

## 2020-09-29 MED ORDER — ACETAMINOPHEN 325 MG PO TABS: 650.0000 mg | ORAL_TABLET | ORAL | Status: DC | PRN

## 2020-09-29 MED ORDER — ENOXAPARIN SODIUM 40 MG/0.4ML ~~LOC~~ SOLN
40.0000 mg | SUBCUTANEOUS | Status: AC
  Administered 2020-09-29: 40 mg via SUBCUTANEOUS
  Filled 2020-09-29: qty 0.4

## 2020-09-29 MED ORDER — CEFAZOLIN SODIUM-DEXTROSE 2-4 GM/100ML-% IV SOLN
2.0000 g | INTRAVENOUS | Status: AC
  Administered 2020-09-29: 2 g via INTRAVENOUS
  Filled 2020-09-29: qty 100

## 2020-09-29 MED ORDER — HYDROMORPHONE HCL 1 MG/ML IJ SOLN
0.2500 mg | INTRAMUSCULAR | Status: DC | PRN
  Administered 2020-09-29 (×2): 0.5 mg via INTRAVENOUS

## 2020-09-29 MED ORDER — KETAMINE HCL 10 MG/ML IJ SOLN
INTRAMUSCULAR | Status: AC
  Filled 2020-09-29: qty 1

## 2020-09-29 MED ORDER — SUGAMMADEX SODIUM 200 MG/2ML IV SOLN
INTRAVENOUS | Status: DC | PRN
  Administered 2020-09-29: 200 mg via INTRAVENOUS

## 2020-09-29 MED ORDER — PROPOFOL 10 MG/ML IV BOLUS
INTRAVENOUS | Status: DC | PRN
  Administered 2020-09-29: 120 mg via INTRAVENOUS

## 2020-09-29 MED ORDER — SODIUM CHLORIDE 0.9% FLUSH: 3.0000 mL | INTRAVENOUS | Status: DC | PRN

## 2020-09-29 MED ORDER — ACETAMINOPHEN 650 MG RE SUPP: 650.0000 mg | RECTAL | Status: DC | PRN

## 2020-09-29 MED ORDER — HYDROCODONE-ACETAMINOPHEN 7.5-325 MG PO TABS: 1.0000 | ORAL_TABLET | Freq: Once | ORAL | Status: DC | PRN

## 2020-09-29 MED ORDER — BUPIVACAINE HCL 0.25 % IJ SOLN
INTRAMUSCULAR | Status: DC | PRN
  Administered 2020-09-29: 30 mL

## 2020-09-29 MED ORDER — EPHEDRINE 5 MG/ML INJ
INTRAVENOUS | Status: AC
  Filled 2020-09-29: qty 10

## 2020-09-29 MED ORDER — ONDANSETRON HCL 4 MG/2ML IJ SOLN: 4.0000 mg | Freq: Once | INTRAMUSCULAR | Status: DC | PRN

## 2020-09-29 MED ORDER — LIDOCAINE 2% (20 MG/ML) 5 ML SYRINGE
INTRAMUSCULAR | Status: DC | PRN
  Administered 2020-09-29: 100 mg via INTRAVENOUS

## 2020-09-29 MED ORDER — PHENYLEPHRINE 40 MCG/ML (10ML) SYRINGE FOR IV PUSH (FOR BLOOD PRESSURE SUPPORT)
PREFILLED_SYRINGE | INTRAVENOUS | Status: DC | PRN
  Administered 2020-09-29 (×2): 80 ug via INTRAVENOUS

## 2020-09-29 MED ORDER — FENTANYL CITRATE (PF) 250 MCG/5ML IJ SOLN
INTRAMUSCULAR | Status: AC
  Filled 2020-09-29: qty 5

## 2020-09-29 MED ORDER — DEXAMETHASONE SODIUM PHOSPHATE 10 MG/ML IJ SOLN
INTRAMUSCULAR | Status: DC | PRN
  Administered 2020-09-29: 10 mg via INTRAVENOUS

## 2020-09-29 MED ORDER — DEXAMETHASONE SODIUM PHOSPHATE 4 MG/ML IJ SOLN: 4.0000 mg | INTRAMUSCULAR | Status: DC

## 2020-09-29 MED ORDER — LACTATED RINGERS IV SOLN: INTRAVENOUS | Status: DC

## 2020-09-29 MED ORDER — LACTATED RINGERS IR SOLN
Status: DC | PRN
  Administered 2020-09-29: 1000 mL

## 2020-09-29 MED ORDER — ONDANSETRON HCL 4 MG/2ML IJ SOLN
INTRAMUSCULAR | Status: DC | PRN
  Administered 2020-09-29: 4 mg via INTRAVENOUS

## 2020-09-29 MED ORDER — STERILE WATER FOR IRRIGATION IR SOLN
Status: DC | PRN
  Administered 2020-09-29: 1000 mL

## 2020-09-29 MED ORDER — FENTANYL CITRATE (PF) 100 MCG/2ML IJ SOLN
INTRAMUSCULAR | Status: DC | PRN
  Administered 2020-09-29 (×2): 50 ug via INTRAVENOUS
  Administered 2020-09-29: 100 ug via INTRAVENOUS
  Administered 2020-09-29: 50 ug via INTRAVENOUS

## 2020-09-29 MED ORDER — KETAMINE HCL 10 MG/ML IJ SOLN
INTRAMUSCULAR | Status: DC | PRN
  Administered 2020-09-29: 30 mg via INTRAVENOUS

## 2020-09-29 MED ORDER — MORPHINE SULFATE (PF) 4 MG/ML IV SOLN: 2.0000 mg | INTRAVENOUS | Status: DC | PRN

## 2020-09-29 MED ORDER — ACETAMINOPHEN 10 MG/ML IV SOLN: 1000.0000 mg | Freq: Once | INTRAVENOUS | Status: DC | PRN

## 2020-09-29 MED ORDER — HYDROMORPHONE HCL 1 MG/ML IJ SOLN
INTRAMUSCULAR | Status: AC
  Administered 2020-09-29: 0.5 mg via INTRAVENOUS
  Filled 2020-09-29: qty 1

## 2020-09-29 MED ORDER — HYDROMORPHONE HCL 1 MG/ML IJ SOLN
INTRAMUSCULAR | Status: DC
Start: 2020-09-29 — End: 2020-09-29
  Filled 2020-09-29: qty 1

## 2020-09-29 MED ORDER — SODIUM CHLORIDE 0.9% FLUSH: 3.0000 mL | Freq: Two times a day (BID) | INTRAVENOUS | Status: DC

## 2020-09-29 MED ORDER — AMISULPRIDE (ANTIEMETIC) 5 MG/2ML IV SOLN: 10.0000 mg | Freq: Once | INTRAVENOUS | Status: DC | PRN

## 2020-09-29 SURGICAL SUPPLY — 69 items
ADH SKN CLS APL DERMABOND .7 (GAUZE/BANDAGES/DRESSINGS) ×2
AGENT HMST KT MTR STRL THRMB (HEMOSTASIS)
APL ESCP 34 STRL LF DISP (HEMOSTASIS)
APPLICATOR SURGIFLO ENDO (HEMOSTASIS) IMPLANT
BACTOSHIELD CHG 4% 4OZ (MISCELLANEOUS) ×1
BAG LAPAROSCOPIC 12 15 PORT 16 (BASKET) IMPLANT
BAG RETRIEVAL 12/15 (BASKET)
BAG SPEC RTRVL LRG 6X4 10 (ENDOMECHANICALS) ×1
BLADE SURG SZ10 CARB STEEL (BLADE) IMPLANT
COVER BACK TABLE 60X90IN (DRAPES) ×2 IMPLANT
COVER TIP SHEARS 8 DVNC (MISCELLANEOUS) ×1 IMPLANT
COVER TIP SHEARS 8MM DA VINCI (MISCELLANEOUS) ×1
COVER WAND RF STERILE (DRAPES) IMPLANT
DECANTER SPIKE VIAL GLASS SM (MISCELLANEOUS) ×2 IMPLANT
DERMABOND ADVANCED (GAUZE/BANDAGES/DRESSINGS) ×2
DERMABOND ADVANCED .7 DNX12 (GAUZE/BANDAGES/DRESSINGS) ×2 IMPLANT
DRAPE ARM DVNC X/XI (DISPOSABLE) ×4 IMPLANT
DRAPE COLUMN DVNC XI (DISPOSABLE) ×1 IMPLANT
DRAPE DA VINCI XI ARM (DISPOSABLE) ×4
DRAPE DA VINCI XI COLUMN (DISPOSABLE) ×1
DRAPE SHEET LG 3/4 BI-LAMINATE (DRAPES) ×2 IMPLANT
DRAPE SURG IRRIG POUCH 19X23 (DRAPES) ×2 IMPLANT
DRSG OPSITE POSTOP 4X6 (GAUZE/BANDAGES/DRESSINGS) IMPLANT
DRSG OPSITE POSTOP 4X8 (GAUZE/BANDAGES/DRESSINGS) IMPLANT
ELECT PENCIL ROCKER SW 15FT (MISCELLANEOUS) ×2 IMPLANT
ELECT REM PT RETURN 15FT ADLT (MISCELLANEOUS) ×2 IMPLANT
GLOVE BIO SURGEON STRL SZ 6 (GLOVE) ×8 IMPLANT
GLOVE BIO SURGEON STRL SZ 6.5 (GLOVE) ×4 IMPLANT
GOWN STRL REUS W/ TWL LRG LVL3 (GOWN DISPOSABLE) ×4 IMPLANT
GOWN STRL REUS W/TWL LRG LVL3 (GOWN DISPOSABLE) ×8
HOLDER FOLEY CATH W/STRAP (MISCELLANEOUS) ×2 IMPLANT
IRRIG SUCT STRYKERFLOW 2 WTIP (MISCELLANEOUS) ×2
IRRIGATION SUCT STRKRFLW 2 WTP (MISCELLANEOUS) ×1 IMPLANT
KIT PROCEDURE DA VINCI SI (MISCELLANEOUS)
KIT PROCEDURE DVNC SI (MISCELLANEOUS) IMPLANT
KIT TURNOVER KIT A (KITS) IMPLANT
MANIPULATOR UTERINE 4.5 ZUMI (MISCELLANEOUS) ×2 IMPLANT
NEEDLE HYPO 22GX1.5 SAFETY (NEEDLE) ×2 IMPLANT
NEEDLE SPNL 18GX3.5 QUINCKE PK (NEEDLE) IMPLANT
OBTURATOR OPTICAL STANDARD 8MM (TROCAR) ×1
OBTURATOR OPTICAL STND 8 DVNC (TROCAR) ×1
OBTURATOR OPTICALSTD 8 DVNC (TROCAR) ×1 IMPLANT
PACK ROBOT GYN CUSTOM WL (TRAY / TRAY PROCEDURE) ×2 IMPLANT
PAD POSITIONING PINK XL (MISCELLANEOUS) ×2 IMPLANT
PORT ACCESS TROCAR AIRSEAL 12 (TROCAR) ×1 IMPLANT
PORT ACCESS TROCAR AIRSEAL 5M (TROCAR) ×1
POUCH SPECIMEN RETRIEVAL 10MM (ENDOMECHANICALS) ×2 IMPLANT
SCRUB CHG 4% DYNA-HEX 4OZ (MISCELLANEOUS) ×1 IMPLANT
SEAL CANN UNIV 5-8 DVNC XI (MISCELLANEOUS) ×3 IMPLANT
SEAL XI 5MM-8MM UNIVERSAL (MISCELLANEOUS) ×3
SET TRI-LUMEN FLTR TB AIRSEAL (TUBING) ×2 IMPLANT
SPONGE LAP 18X18 RF (DISPOSABLE) IMPLANT
SURGIFLO W/THROMBIN 8M KIT (HEMOSTASIS) IMPLANT
SUT MNCRL AB 4-0 PS2 18 (SUTURE) IMPLANT
SUT PDS AB 1 TP1 96 (SUTURE) IMPLANT
SUT VIC AB 0 CT1 27 (SUTURE) ×6
SUT VIC AB 0 CT1 27XBRD ANTBC (SUTURE) ×3 IMPLANT
SUT VIC AB 2-0 CT1 27 (SUTURE)
SUT VIC AB 2-0 CT1 TAPERPNT 27 (SUTURE) IMPLANT
SUT VIC AB 4-0 PS2 18 (SUTURE) ×4 IMPLANT
SYR 10ML LL (SYRINGE) IMPLANT
SYR 20ML LL LF (SYRINGE) IMPLANT
TOWEL OR NON WOVEN STRL DISP B (DISPOSABLE) ×2 IMPLANT
TRAP SPECIMEN MUCUS 40CC (MISCELLANEOUS) ×2 IMPLANT
TRAY FOLEY MTR SLVR 16FR STAT (SET/KITS/TRAYS/PACK) ×2 IMPLANT
TROCAR XCEL NON-BLD 5MMX100MML (ENDOMECHANICALS) IMPLANT
UNDERPAD 30X36 HEAVY ABSORB (UNDERPADS AND DIAPERS) ×2 IMPLANT
WATER STERILE IRR 1000ML POUR (IV SOLUTION) ×2 IMPLANT
YANKAUER SUCT BULB TIP 10FT TU (MISCELLANEOUS) IMPLANT

## 2020-09-29 NOTE — Transfer of Care (Signed)
Immediate Anesthesia Transfer of Care Note  Patient: Shannon [name]  Procedure(s) Performed: XI ROBOTIC ASSISTED TOTAL HYSTERECTOMY WITH BILATERAL SALPINGO OOPHORECTOMY (N/A )  Patient Location: PACU  Anesthesia Type:General  Level of Consciousness: sedated, patient cooperative and responds to stimulation  Airway & Oxygen Therapy: Patient Spontanous Breathing and Patient connected to face mask oxygen  Post-op Assessment: Report given to RN and Post -op Vital signs reviewed and stable  Post vital signs: Reviewed and stable  Last Vitals:  Vitals Value Taken Time  BP 135/74 09/29/20 1130  Temp    Pulse 73 09/29/20 1131  Resp 15 09/29/20 1131  SpO2 100 % 09/29/20 1131  Vitals shown include unvalidated device data.  Last Pain:  Vitals:   09/29/20 0759  TempSrc:   PainSc: 5          Complications: No complications documented.

## 2020-09-29 NOTE — Anesthesia Procedure Notes (Signed)
Procedure Name: Intubation Performed by: Gean Maidens, CRNA Pre-anesthesia Checklist: Patient identified, Emergency Drugs available, Suction available, Patient being monitored and Timeout performed Patient Re-evaluated:Patient Re-evaluated prior to induction Oxygen Delivery Method: Circle system utilized Preoxygenation: Pre-oxygenation with 100% oxygen Induction Type: IV induction Ventilation: Mask ventilation without difficulty Laryngoscope Size: Mac and 4 Grade View: Grade I Tube size: 7.0 mm Number of attempts: 1 Airway Equipment and Method: Stylet Placement Confirmation: ETT inserted through vocal cords under direct vision,  positive ETCO2 and breath sounds checked- equal and bilateral Secured at: 21 cm Tube secured with: Tape Dental Injury: Teeth and Oropharynx as per pre-operative assessment

## 2020-09-29 NOTE — Interval H&P Note (Signed)
History and Physical Interval Note:  09/29/2020 8:53 AM  Shannon [name]  has presented today for surgery, with the diagnosis of POSTMENOPAUSAL BLEEDING, OVARIAN MASS.  The various methods of treatment have been discussed with the patient and family. After consideration of risks, benefits and other options for treatment, the patient has consented to  Procedure(s): XI ROBOTIC ASSISTED TOTAL HYSTERECTOMY WITH BILATERAL SALPINGO OOPHORECTOMY WITH POSSIBLE STAGING (N/A) as a surgical intervention.  The patient's history has been reviewed, patient examined, no change in status, stable for surgery.  I have reviewed the patient's chart and labs.  Questions were answered to the patient's satisfaction.     Thereasa Solo

## 2020-09-29 NOTE — Anesthesia Postprocedure Evaluation (Signed)
Anesthesia Post Note  Patient: Shannon [name]  Procedure(s) Performed: XI ROBOTIC ASSISTED TOTAL HYSTERECTOMY WITH BILATERAL SALPINGO OOPHORECTOMY (N/A )     Patient location during evaluation: PACU Anesthesia Type: General Level of consciousness: awake and alert Pain management: pain level controlled Vital Signs Assessment: post-procedure vital signs reviewed and stable Respiratory status: spontaneous breathing, nonlabored ventilation, respiratory function stable and patient connected to nasal cannula oxygen Cardiovascular status: blood pressure returned to baseline and stable Postop Assessment: no apparent nausea or vomiting Anesthetic complications: no   No complications documented.  Last Vitals:  Vitals:   09/29/20 1145 09/29/20 1200  BP: (!) 143/76 134/74  Pulse: 66 65  Resp: 15 14  Temp:    SpO2: 100% 100%    Last Pain:  Vitals:   09/29/20 1130  TempSrc:   PainSc: [place]

## 2020-09-29 NOTE — Discharge Instructions (Signed)
Return to work: 4 weeks (2 weeks with physical restrictions).  Activity: 1. Be up and out of the bed during the day.  Take a nap if needed.  You may walk up steps but be careful and use the hand rail.  Stair climbing will tire you more than you think, you may need to stop part way and rest.   2. No lifting or straining for 4 weeks.  3. No driving for 1 weeks.  Do Not drive if you are taking narcotic pain medicine.  4. Shower daily.  Use soap and water on your incision and pat dry; don't rub.   5. No sexual activity and nothing in the vagina for 8 weeks.  Medications:  - Take ibuprofen and tylenol first line for pain control. Take these regularly (every 6 hours) to decrease the build up of pain.  - If necessary, for severe pain not relieved by ibuprofen, contact Dr Rossi's office and you will be prescribed percocet.  - While taking percocet you should take sennakot every night to reduce the likelihood of constipation. If this causes diarrhea, stop its use.  Diet: 1. Low sodium Heart Healthy Diet is recommended.  2. It is safe to use a laxative if you have difficulty moving your bowels.   Wound Care: 1. Keep clean and dry.  Shower daily.  Reasons to call the Doctor:   Fever - Oral temperature greater than 100.4 degrees Fahrenheit  Foul-smelling vaginal discharge  Difficulty urinating  Nausea and vomiting  Increased pain at the site of the incision that is unrelieved with pain medicine.  Difficulty breathing with or without chest pain  New calf pain especially if only on one side  Sudden, continuing increased vaginal bleeding with or without clots.   Follow-up: 1. See Emma Rossi in 4 weeks.  Contacts: For questions or concerns you should contact:  Dr. Emma Rossi at [phone] After hours and on week-ends call [phone] and ask to speak to the physician on call for Gynecologic Oncology  After Your Surgery  The information in this section will tell you what  to expect after your surgery, both during your stay and after you leave. You will learn how to safely recover from your surgery. Write down any questions you have and be sure to ask your doctor or nurse.  What to Expect When you wake up after your surgery, you will be in the Post-Anesthesia Care Unit (PACU) or your recovery room. A nurse will be monitoring your body temperature, blood pressure, pulse, and oxygen levels. You may have a urinary catheter in your bladder to help monitor the amount of urine you are making. It should come out before you go home. You will also have compression boots on your lower legs to help your circulation. Your pain medication will be given through an IV line or in tablet form. If you are having pain, tell your nurse. Your nurse will tell you how to recover from your surgery. Below are examples of ways you can help yourself recover safely. . You will be encouraged to walk with the help of your nurse or physical therapist. We will give you medication to relieve pain. Walking helps reduce the risk for blood clots and pneumonia. It also helps to stimulate your bowels so they begin working again. . Use your incentive spirometer. This will help your lungs expand, which prevents pneumonia.   Commonly Asked Questions  Will I have pain after surgery? Yes, you will have some   pain after your surgery, especially in the first few days. Your doctor and nurse will ask you about your pain often. You will be given medication to manage your pain as needed. If your pain is not relieved, please tell your doctor or nurse. It is important to control your pain so you can cough, breathe deeply, use your incentive spirometer, and get out of bed and walk.  Will I be able to eat? Yes, you will be able to eat a regular diet or eat as tolerated. You should start with foods that are soft and easy to digest such as apple sauce and chicken noodle soup. Eat small meals frequently, and then advance  to regular foods. If you experience bloating, gas, or cramps, limit high-fiber foods, including whole grain breads and cereal, nuts, seeds, salads, fresh fruit, broccoli, cabbage, and cauliflower. Will I have pain when I am home? The length of time each person has pain or discomfort varies. You may still have some pain when you go home and will probably be taking pain medication. Follow the guidelines below. . Take your medications as directed and as needed. . Call your doctor if the medication prescribed for you doesn't relieve your pain. . Don't drive or drink alcohol while you're taking prescription pain medication. . As your incision heals, you will have less pain and need less pain medication. A mild pain reliever such as acetaminophen (Tylenol) or ibuprofen (Advil) will relieve aches and discomfort. However, large quantities of acetaminophen may be harmful to your liver. Don't take more acetaminophen than the amount directed on the bottle or as instructed by your doctor or nurse. . Pain medication should help you as you resume your normal activities. Take enough medication to do your exercises comfortably. Pain medication is most effective 30 to 45 minutes after taking it. . Keep track of when you take your pain medication. Taking it when your pain first begins is more effective than waiting for the pain to get worse. Pain medication may cause constipation (having fewer bowel movements than what is normal for you).  How can I prevent constipation? . Go to the bathroom at the same time every day. Your body will get used to going at that time. . If you feel the urge to go, don't put it off. Try to use the bathroom 5 to 15 minutes after meals. . After breakfast is a good time to move your bowels. The reflexes in your colon are strongest at this time. . Exercise, if you can. Walking is an excellent form of exercise. . Drink 8 (8-ounce) glasses (2 liters) of liquids daily, if you can. Drink  water, juices, soups, ice cream shakes, and other drinks that don't have caffeine. Drinks with caffeine, such as coffee and soda, pull fluid out of the body. . Slowly increase the fiber in your diet to 25 to 35 grams per day. Fruits, vegetables, whole grains, and cereals contain fiber. If you have an ostomy or have had recent bowel surgery, check with your doctor or nurse before making any changes in your diet. . Both over-the-counter and prescription medications are available to treat constipation. Start with 1 of the following over-the-counter medications first: o Docusate sodium (Colace) 100 mg. Take ___1__ capsules _2____ times a day. This is a stool softener that causes few side effects. Don't take it with mineral oil. o Polyethylene glycol (MiraLAX) 17 grams daily. o Senna (Senokot) 2 tablets at bedtime. This is a stimulant laxative, which can cause cramping. .   If you haven't had a bowel movement in 2 days, call your doctor or nurse.  Can I shower? Yes, you should shower 24 hours after your surgery. Be sure to shower every day. Taking a warm shower is relaxing and can help decrease muscle aches. Use soap when you shower and gently wash your incision. Pat the areas dry with a towel after showering, and leave your incision uncovered (unless there is drainage). Call your doctor if you see any redness or drainage from your incision. Don't take tub baths until you discuss it with your doctor at the first appointment after your surgery. How do I care for my incisions? You will have several small incisions on your abdomen. The incisions are closed with Steri-Strips or Dermabond. You may also have square white dressings on your incisions (Primapore). You can remove these in the shower 24 hours after your surgery. You should clean your incisions with soap and water. If you go home with Steri-Strips on your incision, they will loosen and may fall off by themselves. If they haven't fallen off within 10  days, you can remove them. If you go home with Dermabond over your sutures (stitches), it will also loosen and peel off.  What are the most common symptoms after a hysterectomy? It's common for you to have some vaginal spotting or light bleeding. You should monitor this with a pad or a panty liner. If you have having heavy bleeding (bleeding through a pad or liner every 1 to 2 hours), call your doctor right away. It's also common to have some discomfort after surgery from the air that was pumped into your abdomen during surgery. To help with this, walk, drink plenty of liquids and make sure to take the stool softeners you received.  When is it safe for me to drive? You may resume driving 2 weeks after surgery, as long as you are not taking pain medication that may make you drowsy.  When can I resume sexual activity? Do not place anything in your vagina or have vaginal intercourse for 8 weeks after your surgery. Some people will need to wait longer than 8 weeks, so speak with your doctor before resuming sexual intercourse.  Will I be able to travel? Yes, you can travel. If you are traveling by plane within a few weeks after your surgery, make sure you get up and walk every hour. Be sure to stretch your legs, drink plenty of liquids, and keep your feet elevated when possible.  Will I need any supplies? Most people do not need any supplies after the surgery. In the rare case that you do need supplies, such as tubes or drains, your nurse will order them for you.  When can I return to work? The time it takes to return to work depends on the type of work you do, the type of surgery you had, and how fast your body heals. Most people can return to work about 2 to 4 weeks after the surgery.  What exercises can I do? Exercise will help you gain strength and feel better. Walking and stair climbing are excellent forms of exercise. Gradually increase the distance you walk. Climb stairs slowly, resting or  stopping as needed. Ask your doctor or nurse before starting more strenuous exercises.  When can I lift heavy objects? Most people should not lift anything heavier than 10 pounds (4.5 kilograms) for at least 4 weeks after surgery. Speak with your doctor about when you can do heavy lifting.    How can I cope with my feelings? After surgery for a serious illness, you may have new and upsetting feelings. Many people say they felt weepy, sad, worried, nervous, irritable, and angry at one time or another. You may find that you can't control some of these feelings. If this happens, it's a good idea to seek emotional support. The first step in coping is to talk about how you feel. Family and friends can help. Your nurse, doctor, and social worker can reassure, support, and guide you. It's always a good idea to let these professionals know how you, your family, and your friends are feeling emotionally. Many resources are available to patients and their families. Whether you're in the hospital or at home, the nurses, doctors, and social workers are here to help you and your family and friends handle the emotional aspects of your illness.  When is my first appointment after surgery? Your first appointment after surgery will be 2 to 4 weeks after surgery. Your nurse will give you instructions on how to make this appointment, including the phone number to call.  What if I have other questions? If you have any questions or concerns, please talk with your doctor or nurse. You can reach them Monday through Friday from 9:00 am to 5:00 pm. After 5:00 pm, during the weekend, and on holidays, call [phone] and ask for the doctor on call for your doctor.  . Have a temperature of 101 F (38.3 C) or higher . Have pain that does not get better with pain medication . Have redness, drainage, or swelling from your incisions

## 2020-09-29 NOTE — Op Note (Signed)
OPERATIVE NOTE 09/29/20  Surgeon: Donaciano Eva   Assistants: Dr Lahoma Crocker (an MD assistant was necessary for tissue manipulation, management of robotic instrumentation, retraction and positioning due to the complexity of the case and hospital policies).   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 Normal appearing left ovary. Normal appendix. Right adnexa replaced by 8cm cystic and solid mass. Frozen section revealed benign right ovarian mass.  Estimated Blood Loss:  25cc      Total IV Fluids: 800 ml         Specimens: washings, right tube and ovary, uterus with cervix and left tube and ovary         Complications:  None; patient tolerated the procedure well.         Disposition: PACU - hemodynamically stable.  Procedure Details  The patient was seen in the Holding Room. The risks, benefits, complications, treatment options, and expected outcomes were discussed with the patient.  The patient concurred with the proposed plan, giving informed consent.  The site of surgery properly noted/marked. The patient was identified as Shannon [name] and the procedure verified as a Robotic-assisted hysterectomy with bilateral salpingo oophorectomy. A Time Out was held and the above information confirmed.  After induction of anesthesia, the patient was draped and prepped in the usual sterile manner. Pt was placed in supine position after anesthesia and draped and prepped in the usual sterile manner. The abdominal drape was placed after the  CholoraPrep had been allowed to dry for 3 minutes.  Her arms were tucked to her side with all appropriate precautions.  The shoulders were stabilized with padded shoulder blocks applied to the acromium processes.  The patient was placed in the semi-lithotomy position in [place].  The perineum was prepped with Betadine. The patient was then prepped. Foley catheter was placed.  A sterile speculum was placed in the vagina.  The cervix was grasped with a single-tooth tenaculum and dilated with Kennon Rounds dilators.  The ZUMI uterine manipulator with a medium colpotomizer ring was placed without difficulty.  A pneum occluder balloon was placed over the manipulator.  OG tube placement was confirmed and to suction.   Next, a 5 mm skin incision was made 1 cm below the subcostal margin in the midclavicular line.  The 5 mm Optiview port and scope was used for direct entry.  Opening pressure was under 10 mm CO2.  The abdomen was insufflated and the findings were noted as above.   At this point and all points during the procedure, the patient's intra-abdominal pressure did not exceed 15 mmHg. Next, a 10 mm skin incision was made in the umbilicus and a right and left port was placed about 10 cm lateral to the robot port on the right and left side.  All ports were placed under direct visualization.  The patient was placed in steep Trendelenburg.  Bowel was folded away into the upper abdomen.  The robot was docked in the normal manner.  The hysterectomy was started after the round ligament on the right side was incised and the retroperitoneum was entered and the pararectal space was developed.  The ureter was noted to  be on the medial leaf of the broad ligament.  The peritoneum above the ureter was incised and stretched and the infundibulopelvic ligament was skeletonized, cauterized and cut.  The posterior peritoneum was taken down to the level of the KOH ring.  The right adnexa was separated at the cornua from the uterus to  place it in an endocatch bag to improve visualization of the pelvic anatomy. The anterior peritoneum was also taken down.  The bladder flap was created to the level of the KOH ring.  The uterine artery on the right side was skeletonized, cauterized and cut in the normal manner.  A similar procedure was performed on the left.  The colpotomy was made and the uterus, cervix, bilateral ovaries and tubes were amputated and delivered through the vagina.  Pedicles were inspected and excellent hemostasis was achieved.    The colpotomy at the vaginal cuff was closed with Vicryl on a CT1 needle in a running manner.  Additional right and left corner sutures were required for hemostasis. Irrigation was used and excellent hemostasis was achieved.  At this point in the procedure was completed.  After frozen section confirmed benign pathology, the robotic instruments were removed under direct visulaization.  The robot was undocked. The 10 mm ports were closed with Vicryl on a UR-5 needle and the fascia was closed with 0 Vicryl on a UR-5 needle.  The skin was closed with 4-0 Vicryl in a subcuticular manner.  Dermabond was applied.  Sponge, lap and needle counts correct x 2.  The patient was taken to the recovery room in stable condition.  The vagina was swabbed with  minimal bleeding noted.   All instrument and needle counts were correct x  3.   The patient was transferred to the recovery room in a stable condition.  Donaciano Eva, MD

## 2020-09-30 ENCOUNTER — Telehealth: Payer: Self-pay

## 2020-09-30 ENCOUNTER — Encounter (HOSPITAL_COMMUNITY): Payer: Self-pay | Admitting: Gynecologic Oncology

## 2020-09-30 LAB — CYTOLOGY - NON PAP

## 2020-09-30 LAB — SURGICAL PATHOLOGY

## 2020-09-30 NOTE — Telephone Encounter (Signed)
Ms Randal states that she is eatin, drinking, and urinating well. She is passing gas. Told her to begin Miralax 1 capful bid until goo bowel movement and then adjust as needed. Afebrile. Incisions are D&I. She is taking tylenol alt with Ibuprofen 800 mg.  Pain is currently a 7/10. Told her to take a Hydrocodone to get the pain below a 5 and take as needed in addition to tylenol and ibuprofen. Ms Sandiford is aware of her post op appointments as well as the office number [phone] to call if she has any questions or concerns.

## 2020-10-04 ENCOUNTER — Telehealth: Payer: Self-pay

## 2020-10-04 NOTE — Telephone Encounter (Signed)
Told Ms Parkhill that the final surgical pathology was benign per Melissa Cross,NP.

## 2020-10-25 ENCOUNTER — Inpatient Hospital Stay: Payer: Medicare PPO | Attending: Gynecologic Oncology | Admitting: Gynecologic Oncology

## 2020-10-25 ENCOUNTER — Encounter: Payer: Self-pay | Admitting: Gynecologic Oncology

## 2020-10-25 ENCOUNTER — Other Ambulatory Visit: Payer: Self-pay

## 2020-10-25 VITALS — BP 157/73 | HR 77 | Temp 98.9°F | Resp 18 | Wt 211.3 lb

## 2020-10-25 DIAGNOSIS — Z90710 Acquired absence of both cervix and uterus: Secondary | ICD-10-CM

## 2020-10-25 DIAGNOSIS — D270 Benign neoplasm of right ovary: Secondary | ICD-10-CM | POA: Insufficient documentation

## 2020-10-25 DIAGNOSIS — Z90722 Acquired absence of ovaries, bilateral: Secondary | ICD-10-CM | POA: Diagnosis not present

## 2020-10-25 DIAGNOSIS — N838 Other noninflammatory disorders of ovary, fallopian tube and broad ligament: Secondary | ICD-10-CM

## 2020-10-25 NOTE — Progress Notes (Signed)
Follow-up  Note: Gyn-Onc  Consult was requested by Dr. Corinna Capra for the evaluation of Shannon [name] 69 y.o. female  CC:  Chief Complaint  Patient presents with  . Postmenopausal bleeding  . Adnexal mass    Assessment/Plan:  Shannon. Hanin [name]  is a 69 y.o.  year old 3 weeks s/p robotic hysterectomy and BSO for a right ovarian mass (benign adenofibroma). She is doing well postop. I have released her back to her OBGYN for ongoing care.   HPI: Shannon [name] is a 69 year old P4 who was seen in consultation at the request of Dr Corinna Capra for evaluation of a right adnexal mass, postmenopausal bleeding, thickened endometrium.  The patient reported an approximate 1 year history of left sided pelvic discomfort.  On September 02, 2020 she began experiencing postmenopausal bleeding of dark red blood.  This prompted her to be seen by the emergency department.  At that time a CT scan of the abdomen and pelvis was performed which showed a uterus measuring 9 cm and a heterogeneous cystic and solid mass in the right adnexa measuring 4.1 x 6.1 cm.  The differential diagnosis including exophytic fibroid.  There was cholelithiasis without cholecystitis.  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  The appendix was normal.  A follow-up transvaginal ultrasound scan was also performed on September 03, 2020 which revealed a uterus measuring 9.9 x 4.4 x 5.7 cm, anteverted, with a 2 cm exophytic fibroid from the fundus and left.  The endometrium was 8 mm in thickness.  There is a trace amount of complex fluid within the endometrial cavity.  The right ovary measured 6.3 x 4.8 x 5.7 cm and was diffusely enlarged with loss of normal ovarian architecture.  The left ovary was not visualized.  She was seen by her OB/GYN office on September 12, 2020 at which time the nurse practitioner attempted endometrial biopsy which revealed fragmented endocervical epithelium with metaplastic changes and scant squamous debris with no  endometrial tissue identified.  Ca1 25 performed on September 06, 2020 was normal at 24.4. Her medical history is most significant for obesity.  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  Preoperatively she underwent endometrial sampling which was benign.  Interval hx: On   09/29/20 she underwent a robotic assisted total hysterectomy, BSO. Intraoperative findings were significant for exophytic fibroids in bilateral lower uterine segments.  Normal-appearing left ovary.  Normal appendix.  The right adnexa was placed by an 8 cm cystic and solid mass.  Frozen section revealed a benign right ovarian mass. Surgery was uncomplicated.  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  The uterus cervix and bilateral fallopian tubes were benign..  Since surgery she has done well with minimal urinary incontinence is her only issue.   Current Meds:  Outpatient Encounter Medications as of 10/25/2020  Medication Sig  . ascorbic acid (VITAMIN C) 500 MG tablet Take 1,000 mg by mouth daily.  . calcium carbonate (CALCIUM 600) 1500 (600 Ca) MG TABS tablet Take 600 mg by mouth daily.  . fexofenadine-pseudoephedrine (ALLEGRA-D 24) 180-240 MG 24 hr tablet Take 1 tablet by mouth as needed.  Marland Kitchen HYDROcodone-acetaminophen (NORCO/VICODIN) 5-325 MG per tablet 1 to 2 tabs every 4 to 6 hours as needed for pain. (Patient taking differently: Take 1-2 tablets by mouth every 4 (four) hours as needed (pain.). )  . ibuprofen (ADVIL) 800 MG tablet  Take 1 tablet by mouth in the morning and at bedtime.   . Multiple Vitamin (MULTIVITAMIN WITH MINERALS) TABS tablet Take 1 tablet by mouth daily.  Vladimir Faster Glycol-Propyl Glycol (SYSTANE) 0.4-0.3 % SOLN Place 1-2 drops into both eyes 3 (three) times daily as needed (dry/irritated eyes.).   No facility-administered encounter medications on file as of  10/25/2020.    Allergy:  Allergies  Allergen Reactions  . Sulfa Antibiotics Shortness Of Breath and Rash    Social Hx:   Social History   Socioeconomic History  . Marital status: Widowed    Spouse name: Not on file  . Number of children: Not on file  . Years of education: Not on file  . Highest education level: Not on file  Occupational History  . Not on file  Tobacco Use  . Smoking status: Never Smoker  . Smokeless tobacco: Never Used  Vaping Use  . Vaping Use: Never used  Substance and Sexual Activity  . Alcohol use: No  . Drug use: No  . Sexual activity: Not on file  Other Topics Concern  . Not on file  Social History Narrative  . Not on file   Social Determinants of Health   Financial Resource Strain:   . Difficulty of Paying Living Expenses: Not on file  Food Insecurity:   . Worried About Charity fundraiser in the Last Year: Not on file  . Ran Out of Food in the Last Year: Not on file  Transportation Needs:   . Lack of Transportation (Medical): Not on file  . Lack of Transportation (Non-Medical): Not on file  Physical Activity:   . Days of Exercise per Week: Not on file  . Minutes of Exercise per Session: Not on file  Stress:   . Feeling of Stress : Not on file  Social Connections:   . Frequency of Communication with Friends and Family: Not on file  . Frequency of Social Gatherings with Friends and Family: Not on file  . Attends Religious Services: Not on file  . Active Member of Clubs or Organizations: Not on file  . Attends Archivist Meetings: Not on file  . Marital Status: Not on file  Intimate Partner Violence:   . Fear of Current or Ex-Partner: Not on file  . Emotionally Abused: Not on file  . Physically Abused: Not on file  . Sexually Abused: Not on file    Past Surgical Hx:  Past Surgical History:  Procedure Laterality Date  . COLONOSCOPY    . ROBOTIC ASSISTED TOTAL HYSTERECTOMY WITH BILATERAL SALPINGO OOPHERECTOMY N/A  09/29/2020   Procedure: XI ROBOTIC ASSISTED TOTAL HYSTERECTOMY WITH BILATERAL SALPINGO OOPHORECTOMY;  Surgeon: Everitt Amber, MD;  Location: [facility];  Service: Gynecology;  Laterality: N/A;  . TUBAL LIGATION      Past Medical Hx:  Past Medical History:  Diagnosis Date  . 1st degree AV block 09/26/2020   Noted on EKG  . Adnexal mass   . Anemia    history of  . Arthritis   . Hearing loss   . History of blood transfusion 1960's  . Hyperlipidemia   . Inflammation around joint    Back hip  . Post-menopausal bleeding   . Tinnitus, bilateral     Past Gynecological History: See HPI, 4 prior vaginal births and a tubal ligation No LMP recorded. Patient is postmenopausal.  Family Hx:  Family History  Problem Relation Age of Onset  . Dementia Mother   . Hypertension  Mother   . Heart disease Mother   . Pneumonia Father     Review of Systems:  Constitutional  Feels well,    ENT Normal appearing ears and nares bilaterally Skin/Breast  No rash, sores, jaundice, itching, dryness Cardiovascular  No chest pain, shortness of breath, or edema  Pulmonary  No cough or wheeze.  Gastro Intestinal  No nausea, vomitting, or diarrhoea. No bright red blood per rectum, no abdominal pain, change in bowel movement, or constipation.  Genito Urinary  No frequency, urgency, dysuria, no bleeding, mild incontinence Musculo Skeletal  No myalgia, arthralgia, joint swelling or pain  Neurologic  No weakness, numbness, change in gait,  Psychology  No depression, anxiety, insomnia.   Vitals:  Blood pressure (!) 157/73, pulse 77, temperature 98.9 F (37.2 C), temperature source Tympanic, resp. rate 18, weight 211 lb 4.8 oz (95.8 kg), SpO2 100 %.  Physical Exam: WD in NAD Neck  Supple NROM, without any enlargements.  Lymph Node Survey No cervical supraclavicular or inguinal adenopathy Cardiovascular  Pulse normal rate, regularity and rhythm. S1 and S2 normal.  Lungs  Clear to auscultation  bilateraly, without wheezes/crackles/rhonchi. Good air movement.  Skin  No rash/lesions/breakdown  Psychiatry  Alert and oriented to person, place, and time  Abdomen  Normoactive bowel sounds, abdomen soft, non-tender and obese without evidence of hernia. Well healed incisions.  Back No CVA tenderness Genito Urinary  Well healed vaginal cuff Rectal  deferred Extremities  No bilateral cyanosis, clubbing or edema.    Thereasa Solo, MD  10/25/2020, 6:05 PM

## 2020-10-25 NOTE — Patient Instructions (Signed)
Dr Denman George recommends coming off of the hydrocodone and only using occasionally.  Dr Denman George is clearing you to resume all activities (except no sex for another month).  You no longer require pap smears or pelvic exams.  Please contact Dr Corinna Capra to discuss bladder leakage if this continues to become problematic.

## 2020-11-30 DIAGNOSIS — M160 Bilateral primary osteoarthritis of hip: Secondary | ICD-10-CM | POA: Diagnosis not present

## 2020-11-30 DIAGNOSIS — G8929 Other chronic pain: Secondary | ICD-10-CM | POA: Diagnosis not present

## 2020-11-30 DIAGNOSIS — M25562 Pain in left knee: Secondary | ICD-10-CM | POA: Diagnosis not present

## 2020-11-30 DIAGNOSIS — Z131 Encounter for screening for diabetes mellitus: Secondary | ICD-10-CM | POA: Diagnosis not present

## 2020-11-30 DIAGNOSIS — M4302 Spondylolysis, cervical region: Secondary | ICD-10-CM | POA: Diagnosis not present

## 2020-11-30 DIAGNOSIS — Z79899 Other long term (current) drug therapy: Secondary | ICD-10-CM | POA: Diagnosis not present

## 2020-11-30 DIAGNOSIS — Z13220 Encounter for screening for lipoid disorders: Secondary | ICD-10-CM | POA: Diagnosis not present

## 2020-11-30 DIAGNOSIS — Z20822 Contact with and (suspected) exposure to covid-19: Secondary | ICD-10-CM | POA: Diagnosis not present

## 2020-12-30 DIAGNOSIS — Z6835 Body mass index (BMI) 35.0-35.9, adult: Secondary | ICD-10-CM | POA: Diagnosis not present

## 2020-12-30 DIAGNOSIS — M160 Bilateral primary osteoarthritis of hip: Secondary | ICD-10-CM | POA: Diagnosis not present

## 2020-12-30 DIAGNOSIS — M4302 Spondylolysis, cervical region: Secondary | ICD-10-CM | POA: Diagnosis not present

## 2020-12-30 DIAGNOSIS — E7800 Pure hypercholesterolemia, unspecified: Secondary | ICD-10-CM | POA: Diagnosis not present

## 2020-12-30 DIAGNOSIS — Z79899 Other long term (current) drug therapy: Secondary | ICD-10-CM | POA: Diagnosis not present

## 2021-01-02 DIAGNOSIS — Z01419 Encounter for gynecological examination (general) (routine) without abnormal findings: Secondary | ICD-10-CM | POA: Diagnosis not present

## 2021-01-02 DIAGNOSIS — Z6834 Body mass index (BMI) 34.0-34.9, adult: Secondary | ICD-10-CM | POA: Diagnosis not present

## 2021-01-02 DIAGNOSIS — R3915 Urgency of urination: Secondary | ICD-10-CM | POA: Diagnosis not present

## 2021-01-02 DIAGNOSIS — Z1231 Encounter for screening mammogram for malignant neoplasm of breast: Secondary | ICD-10-CM | POA: Diagnosis not present

## 2021-01-30 DIAGNOSIS — Z6834 Body mass index (BMI) 34.0-34.9, adult: Secondary | ICD-10-CM | POA: Diagnosis not present

## 2021-01-30 DIAGNOSIS — M4302 Spondylolysis, cervical region: Secondary | ICD-10-CM | POA: Diagnosis not present

## 2021-01-30 DIAGNOSIS — Z79899 Other long term (current) drug therapy: Secondary | ICD-10-CM | POA: Diagnosis not present

## 2021-01-30 DIAGNOSIS — M160 Bilateral primary osteoarthritis of hip: Secondary | ICD-10-CM | POA: Diagnosis not present

## 2021-01-30 DIAGNOSIS — E7800 Pure hypercholesterolemia, unspecified: Secondary | ICD-10-CM | POA: Diagnosis not present

## 2021-03-08 DIAGNOSIS — M9903 Segmental and somatic dysfunction of lumbar region: Secondary | ICD-10-CM | POA: Diagnosis not present

## 2021-03-08 DIAGNOSIS — M5417 Radiculopathy, lumbosacral region: Secondary | ICD-10-CM | POA: Diagnosis not present

## 2021-03-08 DIAGNOSIS — M50321 Other cervical disc degeneration at C4-C5 level: Secondary | ICD-10-CM | POA: Diagnosis not present

## 2021-03-08 DIAGNOSIS — M546 Pain in thoracic spine: Secondary | ICD-10-CM | POA: Diagnosis not present

## 2021-03-08 DIAGNOSIS — M9901 Segmental and somatic dysfunction of cervical region: Secondary | ICD-10-CM | POA: Diagnosis not present

## 2021-03-08 DIAGNOSIS — M542 Cervicalgia: Secondary | ICD-10-CM | POA: Diagnosis not present

## 2021-03-08 DIAGNOSIS — M9902 Segmental and somatic dysfunction of thoracic region: Secondary | ICD-10-CM | POA: Diagnosis not present

## 2021-03-08 DIAGNOSIS — M5137 Other intervertebral disc degeneration, lumbosacral region: Secondary | ICD-10-CM | POA: Diagnosis not present

## 2021-03-22 DIAGNOSIS — M546 Pain in thoracic spine: Secondary | ICD-10-CM | POA: Diagnosis not present

## 2021-03-22 DIAGNOSIS — M542 Cervicalgia: Secondary | ICD-10-CM | POA: Diagnosis not present

## 2021-03-22 DIAGNOSIS — M50321 Other cervical disc degeneration at C4-C5 level: Secondary | ICD-10-CM | POA: Diagnosis not present

## 2021-03-22 DIAGNOSIS — M9902 Segmental and somatic dysfunction of thoracic region: Secondary | ICD-10-CM | POA: Diagnosis not present

## 2021-03-22 DIAGNOSIS — M9901 Segmental and somatic dysfunction of cervical region: Secondary | ICD-10-CM | POA: Diagnosis not present

## 2021-03-22 DIAGNOSIS — M5137 Other intervertebral disc degeneration, lumbosacral region: Secondary | ICD-10-CM | POA: Diagnosis not present

## 2021-03-22 DIAGNOSIS — M9903 Segmental and somatic dysfunction of lumbar region: Secondary | ICD-10-CM | POA: Diagnosis not present

## 2021-03-22 DIAGNOSIS — M5417 Radiculopathy, lumbosacral region: Secondary | ICD-10-CM | POA: Diagnosis not present

## 2021-05-30 ENCOUNTER — Ambulatory Visit: Payer: Medicare PPO | Admitting: Rehabilitative and Restorative Service Providers"

## 2021-06-15 IMAGING — CT CT ABD-PELV W/ CM
2 of 5 series · 15 of 46 positions shown, 17 images · IV contrast (APPLIED)
Comparison: None.

CLINICAL DATA: Lower abdominal pain for 3 days

EXAM:
CT ABDOMEN AND PELVIS WITH CONTRAST
TECHNIQUE: Multidetector CT imaging of the abdomen and pelvis was performed
using the standard protocol following bolus administration of
intravenous contrast.
CONTRAST:  100mL OMNIPAQUE IOHEXOL 300 MG/ML  SOLN

[Series 3: abd/ pelvis 5.0 i30f 2 · axial · 0.88mm/px · z∈[-436,-6]mm · 12 of 98 slices shown, 14 images]
[im 6/98  soft-tissue]
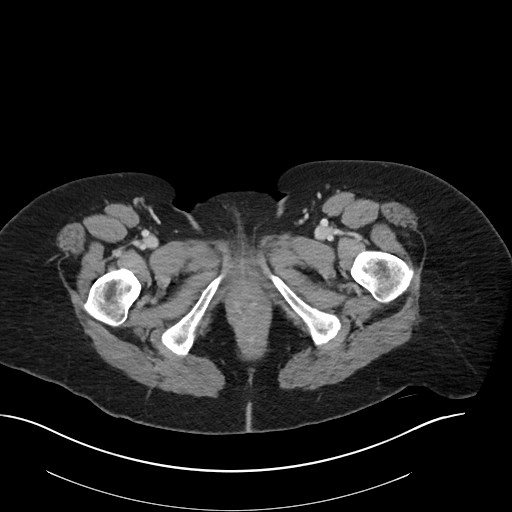
[im 6/98  bone]
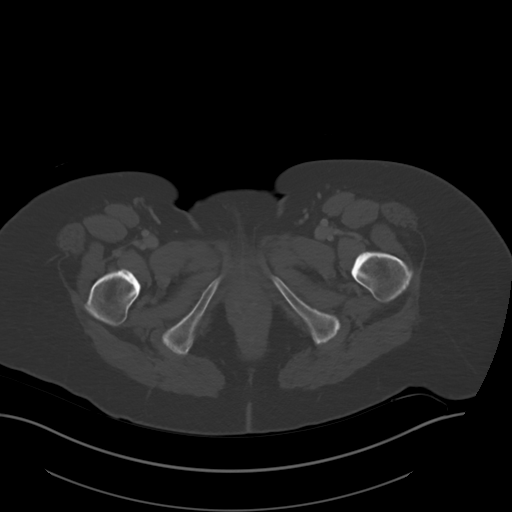
[im 18/98  soft-tissue]
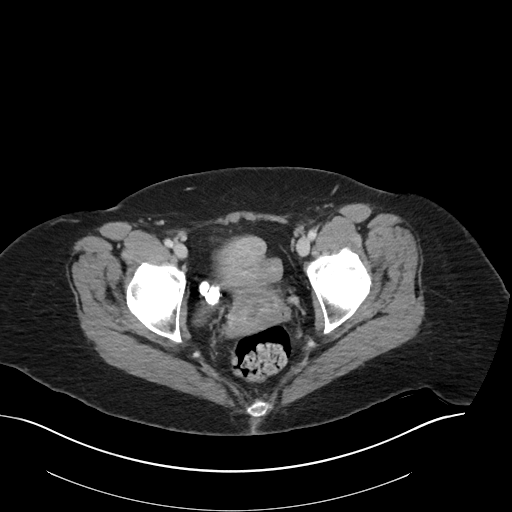
[im 23/98  soft-tissue]
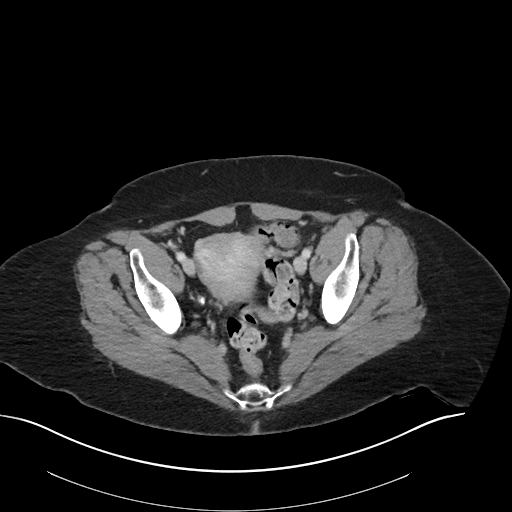
[im 29/98  soft-tissue]
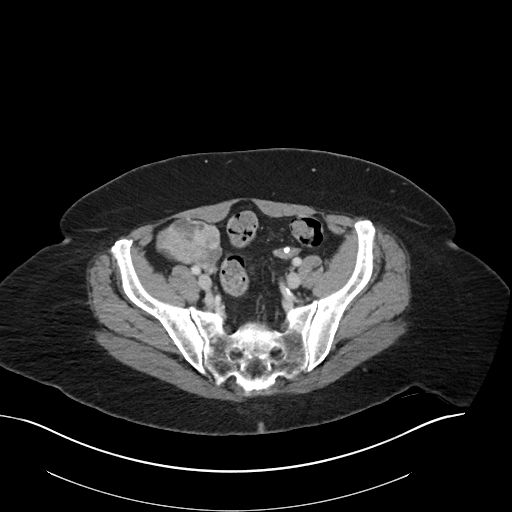
[im 40/98  soft-tissue]
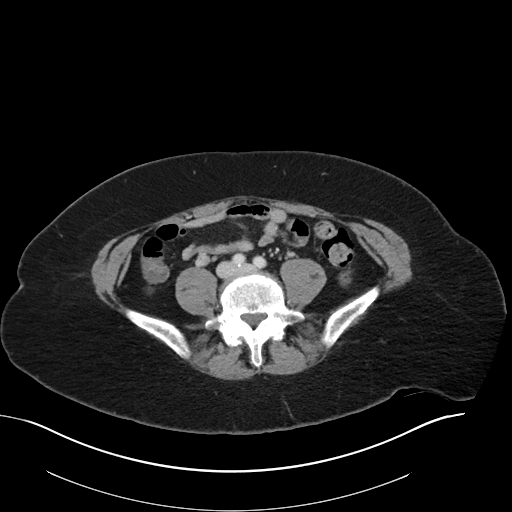
[im 46/98  soft-tissue]
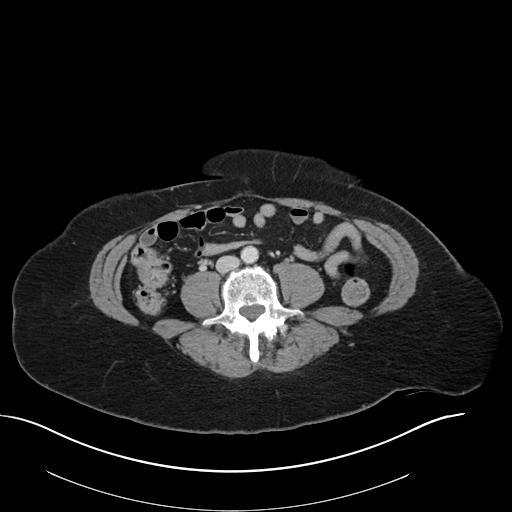
[im 52/98  soft-tissue]
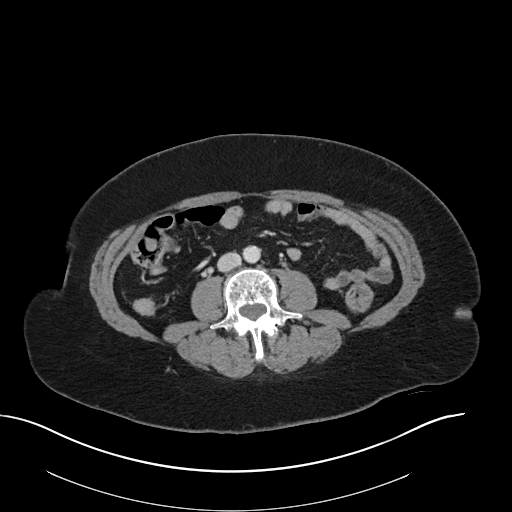
[im 63/98  soft-tissue]
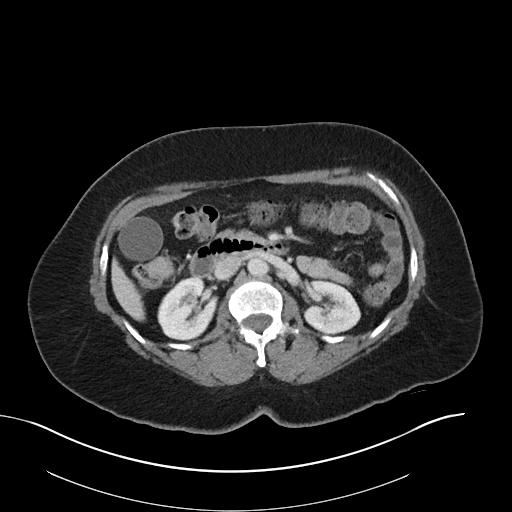
[im 69/98  soft-tissue]
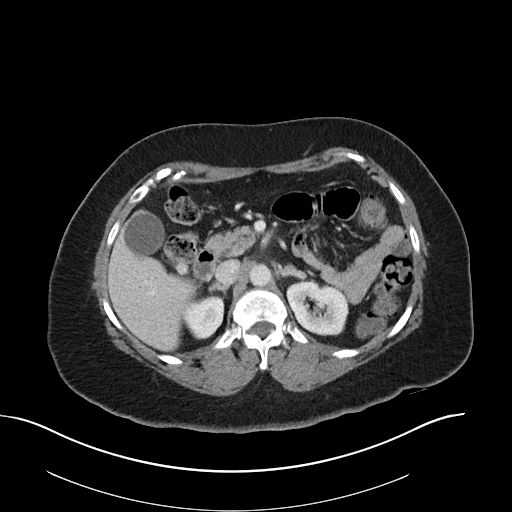
[im 69/98  bone]
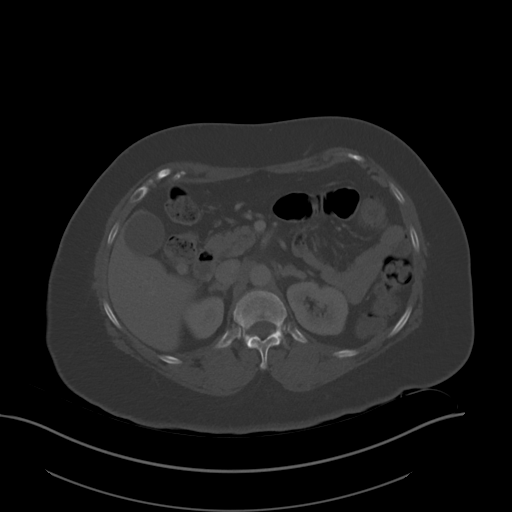
[im 75/98  soft-tissue]
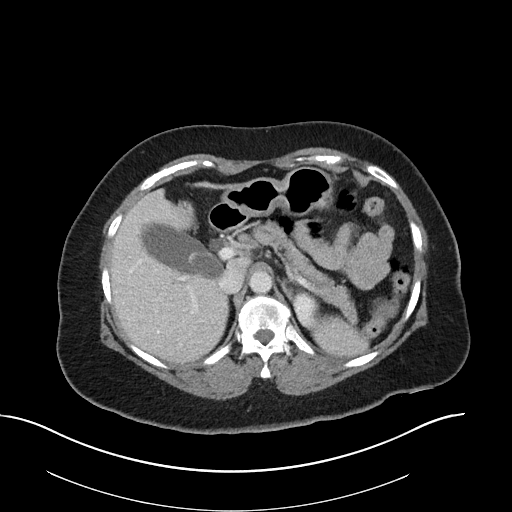
[im 86/98  soft-tissue]
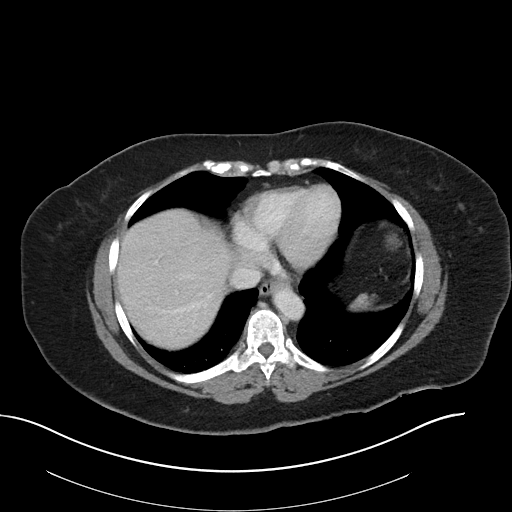
[im 92/98  soft-tissue]
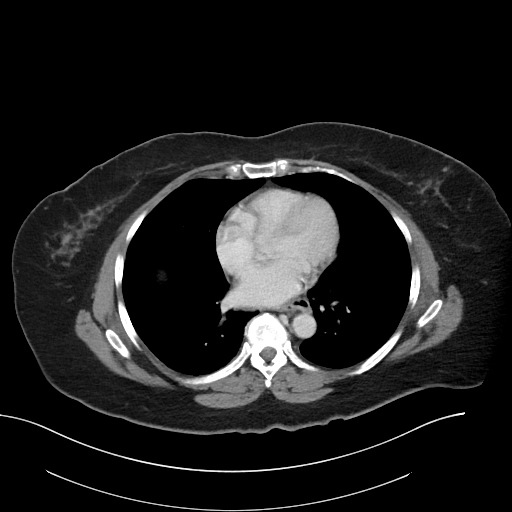

[Series 6: coronal soft tissue · coronal · 0.69mm/px · 3 of 91 slices shown]
[im 31/91  soft-tissue]
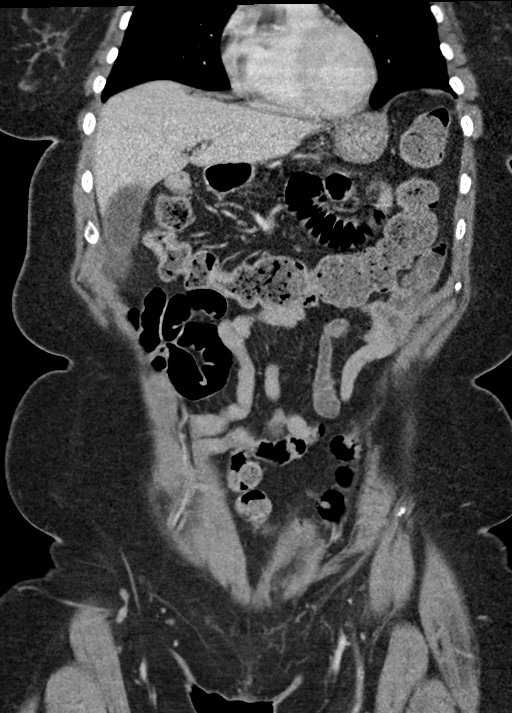
[im 41/91  soft-tissue]
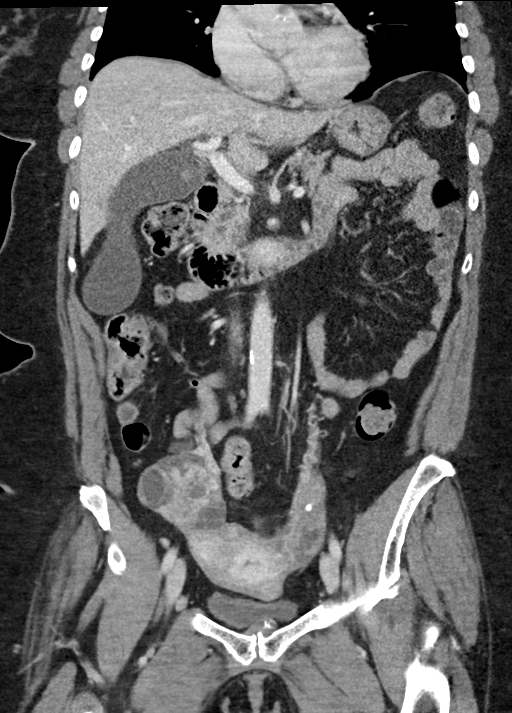
[im 51/91  soft-tissue]
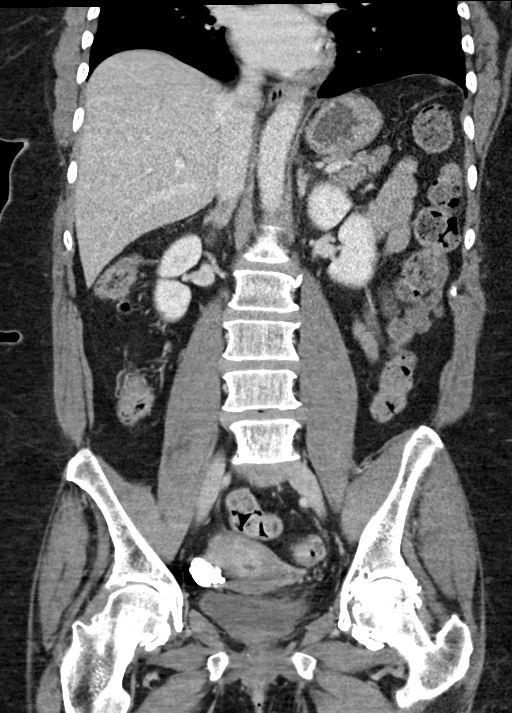

[15 of 46 positions shown; findings below may reference images not displayed]

FINDINGS: Lower chest: Dependent atelectasis in the lung bases. Coronary
artery calcifications.

Hepatobiliary: Large stone in the gallbladder neck. No gallbladder
wall thickening or edema. Mild intrahepatic bile duct dilatation. No
common duct stones identified. No focal liver lesions.

Pancreas: Unremarkable. No pancreatic ductal dilatation or
surrounding inflammatory changes.

Spleen: Normal in size without focal abnormality.

Adrenals/Urinary Tract: Adrenal glands are unremarkable. Kidneys are
normal, without renal calculi, focal lesion, or hydronephrosis.
Bladder is unremarkable.

Stomach/Bowel: Stomach, small bowel, and colon are not abnormally
distended. Stool fills the colon. No wall thickening or inflammatory
changes. The appendix is normal.

Vascular/Lymphatic: Aortic atherosclerosis. No enlarged abdominal or
pelvic lymph nodes.

Reproductive: Uterus is anteverted. Calcified fibroids.
Heterogeneous cystic and solid mass in the right adnexum measuring
4.1 x 6.1 cm diameter. Differential diagnosis would include a
complex ovarian mass or possibly ovarian torsion. Ultrasound is
recommended for further evaluation. No free fluid in the pelvis.

Other: No free air or free fluid in the abdomen. Abdominal wall
musculature appears intact.

Musculoskeletal: Degenerative changes in the spine. No destructive
bone lesions.
IMPRESSION: 1. Heterogeneous cystic and solid mass in the right adnexum
measuring 4.1 x 6.1 cm diameter. This is most likely a complex
ovarian mass, suspicious for neoplasm. Possibly a large exophytic
fibroid. Less likely ovarian torsion. Ultrasound is recommended for
further evaluation.
2. Cholelithiasis without evidence of cholecystitis. Mild
intrahepatic bile duct dilatation.
3. Calcified fibroids.
4. Aortic atherosclerosis.
5. Appendix is normal.

Aortic Atherosclerosis (LVIGG-CWG.G).

## 2023-05-16 ENCOUNTER — Ambulatory Visit: Payer: Medicare PPO | Admitting: Cardiology

## 2023-05-16 ENCOUNTER — Encounter: Payer: Self-pay | Admitting: Cardiology

## 2023-05-16 VITALS — BP 148/86 | HR 86 | Resp 16 | Ht 65.0 in | Wt 208.0 lb

## 2023-05-16 DIAGNOSIS — R9431 Abnormal electrocardiogram [ECG] [EKG]: Secondary | ICD-10-CM

## 2023-05-16 DIAGNOSIS — I493 Ventricular premature depolarization: Secondary | ICD-10-CM

## 2023-05-16 DIAGNOSIS — Z01810 Encounter for preprocedural cardiovascular examination: Secondary | ICD-10-CM

## 2023-05-16 NOTE — Progress Notes (Signed)
ID:  Paulita Cradle, DOB 1951-02-23, MRN [id number]  PCP:  Burnice Logan, PA  Cardiologist:  Tessa Lerner, DO, [facility] (established care 05/16/23 ) Former Cardiology Providers: Dr. Yates Decamp   REASON FOR CONSULT: Preoperative risk stratification/abnormal EKG  REQUESTING PHYSICIAN:  Burnice Logan, PA [address] [place] [place],  [place] [address]  Chief Complaint  Patient presents with   Abnormal ECG   New Patient (Initial Visit)    Abnormal EKG   Pre-op Exam    HPI  Shannon [name] is a 72 y.o. African-American female who presents to the clinic for evaluation of pre-op and abnormal EKG. at the request of Burnice Logan, [place].   Patient is referred to the practice for evaluation of abnormal EKG and preoperative risk stratification.  She is accompanied by her daughter Velna Hatchet at today's office visit and she provides verbal consent for having her daughter present during today's encounter.  Velna Hatchet can be contacted at [phone].  Patient is in the process of getting double hip replacements with Dr. Gean Birchwood at [facility] orthopedic.  The date of surgery is to be determined.  She was referred to her PCP for clearance and due to abnormal EKG she was referred to the practice for further evaluation and management.  Patient denies any active chest pain or shortness of breath at rest or with effort related activities.  However due to osteoarthritis of the hip overall functional capacity is limited.  Patient is nondiabetic, no prior history of ACS, no prior history of severe valvular heart disease/stroke/DVT/PE.  FUNCTIONAL STATUS: Walks once or twice a week for 15 minutes - limited by hip pain. Walks w/ cane.    ALLERGIES: Allergies  Allergen Reactions   Sulfa Antibiotics Shortness Of Breath and Rash    MEDICATION LIST PRIOR TO VISIT: Current Meds  Medication Sig   ascorbic acid (VITAMIN C) 500 MG tablet Take 1,000 mg by mouth daily.   calcium carbonate (CALCIUM 600) 1500 (600  Ca) MG TABS tablet Take 600 mg by mouth daily.   fexofenadine-pseudoephedrine (ALLEGRA-D 24) 180-240 MG 24 hr tablet Take 1 tablet by mouth as needed.   HYDROcodone-acetaminophen (NORCO/VICODIN) 5-325 MG per tablet 1 to 2 tabs every 4 to 6 hours as needed for pain. (Patient taking differently: Take 1-2 tablets by mouth every 4 (four) hours as needed (pain.).)   ibuprofen (ADVIL) 800 MG tablet Take 1 tablet by mouth in the morning and at bedtime.    Polyethyl Glycol-Propyl Glycol (SYSTANE) 0.4-0.3 % SOLN Place 1-2 drops into both eyes 3 (three) times daily as needed (dry/irritated eyes.).     PAST MEDICAL HISTORY: Past Medical History:  Diagnosis Date   1st degree AV block 09/26/2020   Noted on EKG   Adnexal mass    Anemia    history of   Arthritis    Hearing loss    History of blood transfusion 1960's   Hyperlipidemia    Inflammation around joint    Back hip   Post-menopausal bleeding    Tinnitus, bilateral     PAST SURGICAL HISTORY: Past Surgical History:  Procedure Laterality Date   COLONOSCOPY     ROBOTIC ASSISTED TOTAL HYSTERECTOMY WITH BILATERAL SALPINGO OOPHERECTOMY N/A 09/29/2020   Procedure: XI ROBOTIC ASSISTED TOTAL HYSTERECTOMY WITH BILATERAL SALPINGO OOPHORECTOMY;  Surgeon: Adolphus Birchwood, MD;  Location: [facility];  Service: Gynecology;  Laterality: N/A;   TUBAL LIGATION      FAMILY HISTORY: The patient family history includes Dementia in her mother; Diabetes  in her sister; Heart attack (age of onset: 19) in her brother; Heart disease in her mother; Hypertension in her mother; Pneumonia in her father; Stroke in her sister.  SOCIAL HISTORY:  The patient  reports that she has never smoked. She has never used smokeless tobacco. She reports that she does not drink alcohol and does not use drugs.  REVIEW OF SYSTEMS: Review of Systems  Cardiovascular:  Negative for chest pain, claudication, dyspnea on exertion, irregular heartbeat, leg swelling, near-syncope, orthopnea,  palpitations, paroxysmal nocturnal dyspnea and syncope.  Respiratory:  Negative for shortness of breath.   Hematologic/Lymphatic: Negative for bleeding problem.  Musculoskeletal:  Negative for muscle cramps and myalgias.  Neurological:  Negative for dizziness and light-headedness.    PHYSICAL EXAM:    05/16/2023   10:10 AM 10/25/2020    3:40 PM 09/29/2020    3:22 PM  Vitals with BMI  Height 5\' 5"     Weight 208 lbs 211 lbs 5 oz   BMI 34.61    Systolic 148 157 94  Diastolic 86 73 82  Pulse 86 77 76    Physical Exam  Constitutional: No distress.  Age appropriate, hemodynamically stable.   Neck: No JVD present.  Cardiovascular: Normal rate, regular rhythm, S1 normal, S2 normal, intact distal pulses and normal pulses. Frequent extrasystoles are present. Exam reveals no gallop, no S3 and no S4.  Murmur heard. Pulmonary/Chest: Effort normal and breath sounds normal. No stridor. She has no wheezes. She has no rales.  Abdominal: Soft. Bowel sounds are normal. She exhibits no distension. There is no abdominal tenderness.  Musculoskeletal:        General: No edema.     Cervical back: Neck supple.  Neurological: She is alert and oriented to person, place, and time. She has intact cranial nerves (2-12).  Skin: Skin is warm and moist.   CARDIAC DATABASE: EKG: May 16, 2023: Sinus rhythm, 82 bpm, first-degree AV block, frequent PVCs, LAE, without underlying injury pattern  Echocardiogram: No results found for this or any previous visit from the past 1095 days.   Stress Testing: No results found for this or any previous visit from the past 1095 days.  Heart Catheterization: None  LABORATORY DATA:    Latest Ref Rng & Units 09/26/2020    1:48 PM 09/02/2020   11:55 AM  CBC  WBC 4.0 - 10.5 K/uL 4.7  4.7   Hemoglobin 12.0 - 15.0 g/dL 16.1  09.6   Hematocrit 36.0 - 46.0 % 42.1  42.7   Platelets 150 - 400 K/uL 273  301        Latest Ref Rng & Units 09/26/2020    1:48 PM 09/02/2020    11:55 AM  CMP  Glucose 70 - 99 mg/dL 045  93   BUN 8 - 23 mg/dL 17  12   Creatinine 4.09 - 1.00 mg/dL 8.11  9.14   Sodium 782 - 145 mmol/L 139  139   Potassium 3.5 - 5.1 mmol/L 4.0  4.1   Chloride 98 - 111 mmol/L 106  107   CO2 22 - 32 mmol/L 24  23   Calcium 8.9 - 10.3 mg/dL 9.1  9.4   Total Protein 6.5 - 8.1 g/dL 7.1    Total Bilirubin 0.3 - 1.2 mg/dL 0.3    Alkaline Phos 38 - 126 U/L 59    AST 15 - 41 U/L 19    ALT 0 - 44 U/L 19      Lipid Panel  No results found for: "CHOL", "TRIG", "HDL", "CHOLHDL", "VLDL", "LDLCALC", "LDLDIRECT", "LABVLDL"  No components found for: "NTPROBNP" No results for input(s): "PROBNP" in the last 8760 hours. No results for input(s): "TSH" in the last 8760 hours.  BMP No results for input(s): "NA", "K", "CL", "CO2", "GLUCOSE", "BUN", "CREATININE", "CALCIUM", "GFRNONAA", "GFRAA" in the last 8760 hours.  HEMOGLOBIN A1C Lab Results  Component Value Date   HGBA1C 5.7 (H) 09/26/2020   MPG 116.89 09/26/2020   External Labs: Collected: May 07, 2023 provided by PCP. Sodium 143, potassium 4.5, chloride 103, bicarb 29.6. BUN 13, creatinine 1. AST 21, ALT 28, alkaline phosphatase 75. Hemoglobin 14.4, hematocrit 44.2   IMPRESSION:    ICD-10-CM   1. Abnormal EKG  R94.31 EKG 12-Lead    TSH    PCV MYOCARDIAL PERFUSION WITH LEXISCAN    CANCELED: Hemoglobin and hematocrit, blood    CANCELED: Basic metabolic panel    2. Premature ventricular contraction  I49.3 TSH    LONG TERM MONITOR (3-14 DAYS)    CANCELED: Hemoglobin and hematocrit, blood    CANCELED: Basic metabolic panel    3. Preop cardiovascular exam  Z01.810 PCV ECHOCARDIOGRAM COMPLETE       RECOMMENDATIONS: Shannon [name] is a 72 y.o. African-American female who presents to the office for preoperative risk stratification and abnormal EKG.  Patient is referred to practice for evaluation of abnormal EKG and preoperative risk ratification.  EKG shows sinus rhythm with frequent PVCs.   Similar findings are noted on EKG obtained from her PCPs office.  Overall functional capacity is unknown given bilateral osteoarthritis of the hip.  Office blood pressures are also not well-controlled and she does not have a diagnosis of benign essential hypertension.  Blood pressures at home are also not checked.  Both patient and daughter attribute the elevated blood pressure secondary to pain-which is likely possible.  I would still encourage keeping a log of his blood pressures at home to see if pharmacological therapy is warranted.  Given the fact that her functional capacity is limited, EKG shows frequent PVCs, she has upcoming orthopedic surgery requiring anesthesia recommended additional cardiovascular workup.  Echo will be ordered to evaluate for structural heart disease and left ventricular systolic function.  EKG is interpretable patient is unable to exercise therefore pharmacological stress test was requested to evaluate for reversible ischemia prior to upcoming surgery.  Zio patch to evaluate for PVC burden and other dysrhythmias.  Preoperative risk stratification will be provided upon the completion of the above-mentioned work.  Both patient and daughter had an opportunity to ask questions which were addressed to their satisfaction.   FINAL MEDICATION LIST END OF ENCOUNTER: No orders of the defined types were placed in this encounter.   Medications Discontinued During This Encounter  Medication Reason   Multiple Vitamin (MULTIVITAMIN WITH MINERALS) TABS tablet      Current Outpatient Medications:    ascorbic acid (VITAMIN C) 500 MG tablet, Take 1,000 mg by mouth daily., Disp: , Rfl:    calcium carbonate (CALCIUM 600) 1500 (600 Ca) MG TABS tablet, Take 600 mg by mouth daily., Disp: , Rfl:    fexofenadine-pseudoephedrine (ALLEGRA-D 24) 180-240 MG 24 hr tablet, Take 1 tablet by mouth as needed., Disp: , Rfl:    HYDROcodone-acetaminophen (NORCO/VICODIN) 5-325 MG per tablet, 1  to 2 tabs every 4 to 6 hours as needed for pain. (Patient taking differently: Take 1-2 tablets by mouth every 4 (four) hours as needed (pain.).), Disp: 20 tablet, Rfl: 0  ibuprofen (ADVIL) 800 MG tablet, Take 1 tablet by mouth in the morning and at bedtime. , Disp: , Rfl:    Polyethyl Glycol-Propyl Glycol (SYSTANE) 0.4-0.3 % SOLN, Place 1-2 drops into both eyes 3 (three) times daily as needed (dry/irritated eyes.)., Disp: , Rfl:   Orders Placed This Encounter  Procedures   TSH   PCV MYOCARDIAL PERFUSION WITH LEXISCAN   LONG TERM MONITOR (3-14 DAYS)   EKG 12-Lead   PCV ECHOCARDIOGRAM COMPLETE    There are no Patient Instructions on file for this visit.   --Continue cardiac medications as reconciled in final medication list. --Return in about 3 weeks (around 06/06/2023). or sooner if needed. --Continue follow-up with your primary care physician regarding the management of your other chronic comorbid conditions.  Patient's questions and concerns were addressed to her satisfaction. She voices understanding of the instructions provided during this encounter.   This note was created using a voice recognition software as a result there may be grammatical errors inadvertently enclosed that do not reflect the nature of this encounter. Every attempt is made to correct such errors.  Tessa Lerner, [place], [facility]  Pager:  [phone] Office: [phone]

## 2023-05-23 ENCOUNTER — Ambulatory Visit: Payer: Medicare PPO | Admitting: Cardiology

## 2023-06-10 ENCOUNTER — Ambulatory Visit: Payer: Medicare PPO

## 2023-06-10 DIAGNOSIS — R9431 Abnormal electrocardiogram [ECG] [EKG]: Secondary | ICD-10-CM

## 2023-06-10 DIAGNOSIS — Z01810 Encounter for preprocedural cardiovascular examination: Secondary | ICD-10-CM

## 2023-06-20 ENCOUNTER — Encounter: Payer: Self-pay | Admitting: Cardiology

## 2023-06-20 ENCOUNTER — Ambulatory Visit: Payer: Medicare PPO | Admitting: Cardiology

## 2023-06-20 ENCOUNTER — Telehealth: Payer: Self-pay

## 2023-06-20 VITALS — BP 173/87 | HR 84 | Ht 65.0 in | Wt 208.0 lb

## 2023-06-20 DIAGNOSIS — Z01810 Encounter for preprocedural cardiovascular examination: Secondary | ICD-10-CM

## 2023-06-20 DIAGNOSIS — I10 Essential (primary) hypertension: Secondary | ICD-10-CM

## 2023-06-20 DIAGNOSIS — I493 Ventricular premature depolarization: Secondary | ICD-10-CM

## 2023-06-20 MED ORDER — AMLODIPINE BESYLATE 5 MG PO TABS
5.0000 mg | ORAL_TABLET | Freq: Every day | ORAL | 3 refills | Status: AC
Start: 2023-06-20 — End: 2023-09-18

## 2023-06-20 MED ORDER — METOPROLOL SUCCINATE ER 25 MG PO TB24
25.0000 mg | ORAL_TABLET | Freq: Every morning | ORAL | 1 refills | Status: DC
Start: 2023-06-20 — End: 2023-12-19

## 2023-06-20 NOTE — Telephone Encounter (Signed)
Waiting to hear back from the surgeon to see if we need to send a clearance letter or are they going to fax a pre-op clearance to [place].

## 2023-06-20 NOTE — Progress Notes (Addendum)
ID:  Paulita Cradle, DOB [birth year], MRN [id number]  PCP:  Burnice Logan, PA  Cardiologist:  Tessa Lerner, DO, [facility] (established care 05/16/23 ) Former Cardiology Providers: Dr. Yates Decamp   Date: 06/20/23 Last Office Visit: 05/16/2023  Chief Complaint  Patient presents with   Abnormal ECG   Follow-up    HPI  Shannon [name] is a 72 y.o. African-American female whose past medical history and cardiovascular risk factors include: PVC burden, hypertension, obesity.  She is accompanied by her daughter Velna Hatchet at today's office visit and she provides verbal consent for having her daughter present during today's encounter.  Velna Hatchet can be contacted at [phone].  Patient was referred to the practice for preoperative risk stratification as the preop EKG noted sinus rhythm with frequent PVCs.  Her overall functional capacity was limited due to bilateral osteoarthritis of the hip and therefore the shared decision was to proceed with echo and stress test to evaluate her from a cardiovascular standpoint.  Results reviewed with both patient and daughter and noted below for further reference.  Given her PVC Zio patch was recommended and her PVC burden is approximately 6.9%.  Her prior office blood pressures were not well-controlled and therefore she was recommended to keep a log of her blood pressures at home as well.  Since last office visit, denies anginal chest pain or heart failure symptoms.  They have not been checking her blood pressures at home.  Patient is eager to undergo bilateral hip replacements.  Patient states that she is planning to have the right-sided hip addressed first followed by the left.  Patient is nondiabetic, no prior history of ACS, no prior history of severe valvular heart disease/stroke/DVT/PE.  FUNCTIONAL STATUS: Walks once or twice a week for 15 minutes - limited by hip pain. Walks w/ cane.    ALLERGIES: Allergies  Allergen Reactions   Sulfa Antibiotics Shortness  Of Breath and Rash    MEDICATION LIST PRIOR TO VISIT: Current Meds  Medication Sig   acetaminophen (TYLENOL) 650 MG CR tablet Take 650 mg by mouth every 8 (eight) hours as needed for pain.   amLODipine (NORVASC) 5 MG tablet Take 1 tablet (5 mg total) by mouth daily at 10 pm.   ascorbic acid (VITAMIN C) 500 MG tablet Take 1,000 mg by mouth daily.   calcium carbonate (CALCIUM 600) 1500 (600 Ca) MG TABS tablet Take 600 mg by mouth daily.   Cyanocobalamin (VITAMIN B 12 PO) Take 1 tablet by mouth daily in the afternoon.   fexofenadine-pseudoephedrine (ALLEGRA-D 24) 180-240 MG 24 hr tablet Take 1 tablet by mouth as needed.   HYDROcodone-acetaminophen (NORCO/VICODIN) 5-325 MG per tablet 1 to 2 tabs every 4 to 6 hours as needed for pain. (Patient taking differently: Take 1-2 tablets by mouth every 4 (four) hours as needed (pain.).)   metoprolol succinate (TOPROL XL) 25 MG 24 hr tablet Take 1 tablet (25 mg total) by mouth every morning. Take with meals. Hold if systolic blood pressure (top number) less than 100 mmHg or pulse less than 60 bpm.   Polyethyl Glycol-Propyl Glycol (SYSTANE) 0.4-0.3 % SOLN Place 1-2 drops into both eyes 3 (three) times daily as needed (dry/irritated eyes.).     PAST MEDICAL HISTORY: Past Medical History:  Diagnosis Date   1st degree AV block 09/26/2020   Noted on EKG   Adnexal mass    Anemia    history of   Arthritis    Hearing loss    History of blood transfusion  1960's   Hyperlipidemia    Inflammation around joint    Back hip   Post-menopausal bleeding    Tinnitus, bilateral     PAST SURGICAL HISTORY: Past Surgical History:  Procedure Laterality Date   COLONOSCOPY     ROBOTIC ASSISTED TOTAL HYSTERECTOMY WITH BILATERAL SALPINGO OOPHERECTOMY N/A 09/29/2020   Procedure: XI ROBOTIC ASSISTED TOTAL HYSTERECTOMY WITH BILATERAL SALPINGO OOPHORECTOMY;  Surgeon: Adolphus Birchwood, MD;  Location: [facility];  Service: Gynecology;  Laterality: N/A;   TUBAL LIGATION       FAMILY HISTORY: The patient family history includes Dementia in her mother; Diabetes in her sister; Heart attack (age of onset: 44) in her brother; Heart disease in her mother; Hypertension in her mother; Pneumonia in her father; Stroke in her sister.  SOCIAL HISTORY:  The patient  reports that she has never smoked. She has never used smokeless tobacco. She reports that she does not drink alcohol and does not use drugs.  REVIEW OF SYSTEMS: Review of Systems  Cardiovascular:  Negative for chest pain, claudication, dyspnea on exertion, irregular heartbeat, leg swelling, near-syncope, orthopnea, palpitations, paroxysmal nocturnal dyspnea and syncope.  Respiratory:  Negative for shortness of breath.   Hematologic/Lymphatic: Negative for bleeding problem.  Musculoskeletal:  Negative for muscle cramps and myalgias.  Neurological:  Negative for dizziness and light-headedness.    PHYSICAL EXAM:    06/20/2023   10:22 AM 05/16/2023   10:10 AM 10/25/2020    3:40 PM  Vitals with BMI  Height 5\' 5"  5\' 5"    Weight 208 lbs 208 lbs 211 lbs 5 oz  BMI 34.61 34.61   Systolic 173 148 161  Diastolic 87 86 73  Pulse 84 86 77    Physical Exam  Constitutional: No distress.  Age appropriate, hemodynamically stable.   Neck: No JVD present.  Cardiovascular: Normal rate, regular rhythm, S1 normal, S2 normal, intact distal pulses and normal pulses. Occasional extrasystoles are present. Exam reveals no gallop, no S3 and no S4.  No murmur heard. Pulmonary/Chest: Effort normal and breath sounds normal. No stridor. She has no wheezes. She has no rales.  Abdominal: Soft. Bowel sounds are normal. She exhibits no distension. There is no abdominal tenderness.  Musculoskeletal:        General: No edema.     Cervical back: Neck supple.  Neurological: She is alert and oriented to person, place, and time. She has intact cranial nerves (2-12).  Skin: Skin is warm and moist.   CARDIAC DATABASE: EKG: May 16, 2023: Sinus rhythm, 82 bpm, first-degree AV block, frequent PVCs, LAE, without underlying injury pattern  Echocardiogram: 06/10/2023: Left ventricle cavity is normal in size. Normal left ventricular wall thickness. Normal global wall motion. Normal LV systolic function with EF 59%. Doppler evidence of grade I (impaired) diastolic dysfunction, normal LAP.  Aneurysmal interatrial septum without 2D or color Doppler evidence of shunting. No significant valvular abnormality. Normal right atrial pressure.    Stress Testing: Lexiscan Tetrofosmin stress test 06/10/2023: 1 Day Rest/Stress protocol.  Stress EKG is non-diagnostic, as this is pharmacological stress test using Lexiscan, target heart rate not achieved, baseline ST-T changes, PVCs noted during rest and stress. Normal myocardial perfusion with diaphragmatic attenuation.  No convincing evidence of prior ischemia or infarct. Calculated LVEF 61%, wall thickness preserved, no wall motion abnormalities. No prior studies available for comparison. Normal study.   Heart Catheterization: None  Cardiac monitor [facility] Patch): June 10, 2023 -June 15, 2023 Dominant rhythm sinus rhythm. Heart rate 55-160 bpm.  Avg HR 73 bpm. No atrial fibrillation, ventricular tachycardia, high grade AV block, pauses (3 seconds or longer). Total ventricular ectopic burden 6.9%. Total supraventricular ectopic burden <1%. Patient triggered events: 0.    LABORATORY DATA:    Latest Ref Rng & Units 09/26/2020    1:48 PM 09/02/2020   11:55 AM  CBC  WBC 4.0 - 10.5 K/uL 4.7  4.7   Hemoglobin 12.0 - 15.0 g/dL 45.4  09.8   Hematocrit 36.0 - 46.0 % 42.1  42.7   Platelets 150 - 400 K/uL 273  301        Latest Ref Rng & Units 09/26/2020    1:48 PM 09/02/2020   11:55 AM  CMP  Glucose 70 - 99 mg/dL 119  93   BUN 8 - 23 mg/dL 17  12   Creatinine 1.47 - 1.00 mg/dL 8.29  5.62   Sodium 130 - 145 mmol/L 139  139   Potassium 3.5 - 5.1 mmol/L 4.0  4.1   Chloride 98 - 111  mmol/L 106  107   CO2 22 - 32 mmol/L 24  23   Calcium 8.9 - 10.3 mg/dL 9.1  9.4   Total Protein 6.5 - 8.1 g/dL 7.1    Total Bilirubin 0.3 - 1.2 mg/dL 0.3    Alkaline Phos 38 - 126 U/L 59    AST 15 - 41 U/L 19    ALT 0 - 44 U/L 19      Lipid Panel  No results found for: "CHOL", "TRIG", "HDL", "CHOLHDL", "VLDL", "LDLCALC", "LDLDIRECT", "LABVLDL"  No components found for: "NTPROBNP" No results for input(s): "PROBNP" in the last 8760 hours. No results for input(s): "TSH" in the last 8760 hours.  BMP No results for input(s): "NA", "K", "CL", "CO2", "GLUCOSE", "BUN", "CREATININE", "CALCIUM", "GFRNONAA", "GFRAA" in the last 8760 hours.  HEMOGLOBIN A1C Lab Results  Component Value Date   HGBA1C 5.7 (H) 09/26/2020   MPG 116.89 09/26/2020   External Labs: Collected: May 07, 2023 provided by PCP. Sodium 143, potassium 4.5, chloride 103, bicarb 29.6. BUN 13, creatinine 1. AST 21, ALT 28, alkaline phosphatase 75. Hemoglobin 14.4, hematocrit 44.2   IMPRESSION:    ICD-10-CM   1. Preop cardiovascular exam  Z01.810     2. Benign hypertension  I10 amLODipine (NORVASC) 5 MG tablet    3. Premature ventricular contraction  I49.3 metoprolol succinate (TOPROL XL) 25 MG 24 hr tablet       RECOMMENDATIONS: Onelia Zeien is a 72 y.o. African-American female whose past medical history and cardiovascular risk factors include: PVC burden, hypertension, obesity.  Preop cardiovascular exam Tentatively being scheduled for hip replacement. Has undergone echo and stress test as outlined above. Overall acceptable risk for upcoming noncardiac surgery.   However, I have asked her to address her blood pressures as well as start metoprolol for PVC management. After starting her blood pressure pills if her systolic blood pressures are still greater than 130 mmHg have asked her to call the office for further medication titration. Patient and daughter verbalized understanding and are acceptable for  their preoperative risk stratification.  Benign hypertension Noted on multiple office visits. Given the upcoming surgery would like to optimize her blood pressures to promote favorable outcomes and avoid cancellation on the day of surgery. Start amlodipine 5 mg p.o. every afternoon. Low-salt diet recommended Increase physical activity as tolerated with a goal of 30 minutes a day 5 days a week. Did not choose losartan or HCTZ as a first-line  antihypertensive medication of choice as patient is reluctant to get labs to evaluate kidney function and electrolytes thereafter.  Premature ventricular contraction PVC burden approximately 6.9%. Start Toprol-XL 25 mg p.o. daily with holding parameters.  Monitor for now.   FINAL MEDICATION LIST END OF ENCOUNTER: Meds ordered this encounter  Medications   metoprolol succinate (TOPROL XL) 25 MG 24 hr tablet    Sig: Take 1 tablet (25 mg total) by mouth every morning. Take with meals. Hold if systolic blood pressure (top number) less than 100 mmHg or pulse less than 60 bpm.    Dispense:  90 tablet    Refill:  1   amLODipine (NORVASC) 5 MG tablet    Sig: Take 1 tablet (5 mg total) by mouth daily at 10 pm.    Dispense:  180 tablet    Refill:  3    Medications Discontinued During This Encounter  Medication Reason   ibuprofen (ADVIL) 800 MG tablet      Current Outpatient Medications:    acetaminophen (TYLENOL) 650 MG CR tablet, Take 650 mg by mouth every 8 (eight) hours as needed for pain., Disp: , Rfl:    amLODipine (NORVASC) 5 MG tablet, Take 1 tablet (5 mg total) by mouth daily at 10 pm., Disp: 180 tablet, Rfl: 3   ascorbic acid (VITAMIN C) 500 MG tablet, Take 1,000 mg by mouth daily., Disp: , Rfl:    calcium carbonate (CALCIUM 600) 1500 (600 Ca) MG TABS tablet, Take 600 mg by mouth daily., Disp: , Rfl:    Cyanocobalamin (VITAMIN B 12 PO), Take 1 tablet by mouth daily in the afternoon., Disp: , Rfl:    fexofenadine-pseudoephedrine (ALLEGRA-D 24)  180-240 MG 24 hr tablet, Take 1 tablet by mouth as needed., Disp: , Rfl:    HYDROcodone-acetaminophen (NORCO/VICODIN) 5-325 MG per tablet, 1 to 2 tabs every 4 to 6 hours as needed for pain. (Patient taking differently: Take 1-2 tablets by mouth every 4 (four) hours as needed (pain.).), Disp: 20 tablet, Rfl: 0   metoprolol succinate (TOPROL XL) 25 MG 24 hr tablet, Take 1 tablet (25 mg total) by mouth every morning. Take with meals. Hold if systolic blood pressure (top number) less than 100 mmHg or pulse less than 60 bpm., Disp: 90 tablet, Rfl: 1   Polyethyl Glycol-Propyl Glycol (SYSTANE) 0.4-0.3 % SOLN, Place 1-2 drops into both eyes 3 (three) times daily as needed (dry/irritated eyes.)., Disp: , Rfl:   No orders of the defined types were placed in this encounter.   There are no Patient Instructions on file for this visit.   --Continue cardiac medications as reconciled in final medication list. --Return in about 3 months (around 09/20/2023) for Follow up PVCs. or sooner if needed. --Continue follow-up with your primary care physician regarding the management of your other chronic comorbid conditions.  Patient's questions and concerns were addressed to her satisfaction. She voices understanding of the instructions provided during this encounter.   This note was created using a voice recognition software as a result there may be grammatical errors inadvertently enclosed that do not reflect the nature of this encounter. Every attempt is made to correct such errors.  Tessa Lerner, [place], [facility]  Pager:  [phone] Office: [phone]

## 2023-06-24 NOTE — Telephone Encounter (Signed)
Called Dr. Wadie Lessen office and had to leave a message with the surgical coordinator to call me back with this information.

## 2023-06-25 NOTE — Telephone Encounter (Signed)
Clearance given to Dr. Odis Hollingshead.

## 2023-06-28 ENCOUNTER — Encounter: Payer: Self-pay | Admitting: Cardiology

## 2023-08-20 ENCOUNTER — Encounter: Payer: Self-pay | Admitting: Cardiology

## 2023-08-20 ENCOUNTER — Telehealth: Payer: Self-pay | Admitting: Cardiology

## 2023-08-20 DIAGNOSIS — Z01810 Encounter for preprocedural cardiovascular examination: Secondary | ICD-10-CM

## 2023-08-20 NOTE — Telephone Encounter (Signed)
Received a preoperative clearance for left total anterior hip arthroplasty.  Patient recently underwent thorough cardiovascular workup prior to her right total hip arthroplasty.  Her right hip replacement went well without any cardiovascular complications.  Her functional status remains stable.  No anginal chest pain or heart failure symptoms.  Blood pressures at home are now better controlled according to the daughter.  Prior cardiac workup reviewed.  No additional testing warranted at this time.  Patient is optimized.  Her preoperative risk is acceptable from a cardiovascular standpoint for her upcoming noncardiac surgery.  Total time spent: 7 minutes  Delilah Shan [facility]  Pager:  [phone] Office: [phone]

## 2023-09-17 ENCOUNTER — Ambulatory Visit: Payer: Medicare PPO | Admitting: Cardiology

## 2023-12-18 ENCOUNTER — Other Ambulatory Visit: Payer: Self-pay | Admitting: Cardiology

## 2023-12-18 DIAGNOSIS — I493 Ventricular premature depolarization: Secondary | ICD-10-CM

## 2024-01-30 ENCOUNTER — Other Ambulatory Visit: Payer: Self-pay | Admitting: Family Medicine

## 2024-01-30 DIAGNOSIS — Z1231 Encounter for screening mammogram for malignant neoplasm of breast: Secondary | ICD-10-CM
